# Patient Record
Sex: Male | Born: 1975 | Race: White | Hispanic: No | Marital: Single | Smoking: Current every day smoker
Health system: Southern US, Community
[De-identification: ages and names within clinical notes are randomized; demographics above are authoritative.]

## PROBLEM LIST (undated history)

## (undated) DIAGNOSIS — F319 Bipolar disorder, unspecified: Secondary | ICD-10-CM

## (undated) DIAGNOSIS — T7840XA Allergy, unspecified, initial encounter: Secondary | ICD-10-CM

## (undated) DIAGNOSIS — F1020 Alcohol dependence, uncomplicated: Secondary | ICD-10-CM

## (undated) DIAGNOSIS — J189 Pneumonia, unspecified organism: Secondary | ICD-10-CM

## (undated) DIAGNOSIS — Z720 Tobacco use: Secondary | ICD-10-CM

## (undated) DIAGNOSIS — F1010 Alcohol abuse, uncomplicated: Secondary | ICD-10-CM

## (undated) HISTORY — PX: APPENDECTOMY: SHX54

## (undated) HISTORY — DX: Allergy, unspecified, initial encounter: T78.40XA

## (undated) HISTORY — PX: HERNIA REPAIR: SHX51

## (undated) HISTORY — PX: SHOULDER ARTHROSCOPY: SHX128

---

## 2000-01-19 ENCOUNTER — Encounter: Payer: Self-pay | Admitting: Internal Medicine

## 2000-01-19 ENCOUNTER — Inpatient Hospital Stay (HOSPITAL_COMMUNITY): Admission: EM | Admit: 2000-01-19 | Discharge: 2000-01-20 | Payer: Self-pay | Admitting: Psychiatry

## 2000-01-19 ENCOUNTER — Observation Stay (HOSPITAL_COMMUNITY): Admission: EM | Admit: 2000-01-19 | Discharge: 2000-01-19 | Payer: Self-pay | Admitting: Emergency Medicine

## 2000-01-21 ENCOUNTER — Other Ambulatory Visit (HOSPITAL_COMMUNITY): Admission: RE | Admit: 2000-01-21 | Discharge: 2000-01-23 | Payer: Self-pay | Admitting: *Deleted

## 2000-06-15 ENCOUNTER — Emergency Department (HOSPITAL_COMMUNITY): Admission: EM | Admit: 2000-06-15 | Discharge: 2000-06-15 | Payer: Self-pay | Admitting: Emergency Medicine

## 2000-06-17 ENCOUNTER — Emergency Department (HOSPITAL_COMMUNITY): Admission: EM | Admit: 2000-06-17 | Discharge: 2000-06-17 | Payer: Self-pay | Admitting: Emergency Medicine

## 2001-03-16 ENCOUNTER — Encounter: Payer: Self-pay | Admitting: Emergency Medicine

## 2001-03-16 ENCOUNTER — Emergency Department (HOSPITAL_COMMUNITY): Admission: EM | Admit: 2001-03-16 | Discharge: 2001-03-16 | Payer: Self-pay | Admitting: Emergency Medicine

## 2001-04-21 ENCOUNTER — Inpatient Hospital Stay (HOSPITAL_COMMUNITY): Admission: EM | Admit: 2001-04-21 | Discharge: 2001-04-23 | Payer: Self-pay

## 2001-04-22 ENCOUNTER — Encounter: Payer: Self-pay | Admitting: General Surgery

## 2012-08-22 ENCOUNTER — Emergency Department (HOSPITAL_COMMUNITY)
Admission: EM | Admit: 2012-08-22 | Discharge: 2012-08-23 | Disposition: A | Discharge status: Home / Self Care | Payer: Self-pay | Attending: Emergency Medicine | Admitting: Emergency Medicine

## 2012-08-22 ENCOUNTER — Encounter (HOSPITAL_COMMUNITY): Payer: Self-pay | Admitting: *Deleted

## 2012-08-22 DIAGNOSIS — Z8701 Personal history of pneumonia (recurrent): Secondary | ICD-10-CM | POA: Insufficient documentation

## 2012-08-22 DIAGNOSIS — F911 Conduct disorder, childhood-onset type: Secondary | ICD-10-CM | POA: Insufficient documentation

## 2012-08-22 DIAGNOSIS — F1010 Alcohol abuse, uncomplicated: Secondary | ICD-10-CM

## 2012-08-22 DIAGNOSIS — Z0289 Encounter for other administrative examinations: Secondary | ICD-10-CM | POA: Insufficient documentation

## 2012-08-22 DIAGNOSIS — R45851 Suicidal ideations: Secondary | ICD-10-CM | POA: Insufficient documentation

## 2012-08-22 DIAGNOSIS — F17200 Nicotine dependence, unspecified, uncomplicated: Secondary | ICD-10-CM | POA: Insufficient documentation

## 2012-08-22 DIAGNOSIS — F10920 Alcohol use, unspecified with intoxication, uncomplicated: Secondary | ICD-10-CM

## 2012-08-22 DIAGNOSIS — F10929 Alcohol use, unspecified with intoxication, unspecified: Secondary | ICD-10-CM | POA: Diagnosis present

## 2012-08-22 HISTORY — DX: Pneumonia, unspecified organism: J18.9

## 2012-08-22 LAB — COMPREHENSIVE METABOLIC PANEL
ALT: 49 U/L (ref 0–53)
AST: 64 U/L — ABNORMAL HIGH (ref 0–37)
Albumin: 4.1 g/dL (ref 3.5–5.2)
Alkaline Phosphatase: 52 U/L (ref 39–117)
BUN: 9 mg/dL (ref 6–23)
CO2: 24 mEq/L (ref 19–32)
Calcium: 9.8 mg/dL (ref 8.4–10.5)
Chloride: 103 mEq/L (ref 96–112)
Creatinine, Ser: 0.72 mg/dL (ref 0.50–1.35)
GFR calc Af Amer: >90 mL/min (ref >90)
GFR calc non Af Amer: >90 mL/min (ref >90)
Glucose, Bld: 92 mg/dL (ref 70–99)
Potassium: 3.8 mEq/L (ref 3.5–5.1)
Sodium: 142 mEq/L (ref 135–145)
Total Bilirubin: 0.3 mg/dL (ref 0.3–1.2)
Total Protein: 7.8 g/dL (ref 6.0–8.3)

## 2012-08-22 LAB — CBC
HCT: 48.3 % (ref 39.0–52.0)
Hemoglobin: 16.9 g/dL (ref 13.0–17.0)
MCH: 32.9 pg (ref 26.0–34.0)
MCHC: 35 g/dL (ref 30.0–36.0)
MCV: 94 fL (ref 78.0–100.0)
Platelets: 242 10*3/uL (ref 150–400)
RBC: 5.14 MIL/uL (ref 4.22–5.81)
RDW: 13 % (ref 11.5–15.5)
WBC: 11.8 10*3/uL — ABNORMAL HIGH (ref 4.0–10.5)

## 2012-08-22 LAB — RAPID URINE DRUG SCREEN, HOSP PERFORMED
Amphetamines: NOT DETECTED
Barbiturates: NOT DETECTED
Benzodiazepines: NOT DETECTED
Cocaine: NOT DETECTED
Opiates: NOT DETECTED
Tetrahydrocannabinol: NOT DETECTED

## 2012-08-22 LAB — SALICYLATE LEVEL: Salicylate Lvl: <2 mg/dL — ABNORMAL LOW (ref 2.8–20.0)

## 2012-08-22 LAB — ACETAMINOPHEN LEVEL: Acetaminophen (Tylenol), Serum: <15 ug/mL (ref 10–30)

## 2012-08-22 MED ORDER — THIAMINE HCL 100 MG/ML IJ SOLN: 100.0000 mg | Freq: Every day | INTRAMUSCULAR | Status: DC

## 2012-08-22 MED ORDER — ADULT MULTIVITAMIN W/MINERALS CH
1.0000 | ORAL_TABLET | Freq: Every day | ORAL | Status: DC
  Administered 2012-08-23: 1 via ORAL
  Filled 2012-08-22: qty 1

## 2012-08-22 MED ORDER — FOLIC ACID 1 MG PO TABS
1.0000 mg | ORAL_TABLET | Freq: Every day | ORAL | Status: DC
  Administered 2012-08-23: 1 mg via ORAL
  Filled 2012-08-22: qty 1

## 2012-08-22 MED ORDER — VITAMIN B-1 100 MG PO TABS
100.0000 mg | ORAL_TABLET | Freq: Every day | ORAL | Status: DC
  Administered 2012-08-23: 100 mg via ORAL
  Filled 2012-08-22: qty 1

## 2012-08-22 MED ORDER — NICOTINE 21 MG/24HR TD PT24
21.0000 mg | MEDICATED_PATCH | Freq: Every day | TRANSDERMAL | Status: DC
  Administered 2012-08-22 – 2012-08-23 (×2): 21 mg via TRANSDERMAL
  Filled 2012-08-22 (×2): qty 1

## 2012-08-22 MED ORDER — LORAZEPAM 2 MG/ML IJ SOLN: 1.0000 mg | Freq: Four times a day (QID) | INTRAMUSCULAR | Status: DC | PRN

## 2012-08-22 MED ORDER — ZIPRASIDONE MESYLATE 20 MG IM SOLR
20.0000 mg | Freq: Once | INTRAMUSCULAR | Status: AC
  Administered 2012-08-22: 20 mg via INTRAMUSCULAR

## 2012-08-22 MED ORDER — ZIPRASIDONE MESYLATE 20 MG IM SOLR
10.0000 mg | Freq: Once | INTRAMUSCULAR | Status: AC
Start: 2012-08-22 — End: 2012-08-22
  Administered 2012-08-22: 10 mg via INTRAMUSCULAR
  Filled 2012-08-22: qty 20

## 2012-08-22 MED ORDER — LORAZEPAM 1 MG PO TABS
1.0000 mg | ORAL_TABLET | Freq: Four times a day (QID) | ORAL | Status: DC | PRN
  Administered 2012-08-22: 1 mg via ORAL
  Filled 2012-08-22: qty 1

## 2012-08-22 NOTE — ED Notes (Signed)
Pt refusing to change clothes, states we are violating his rights, called ? His attorney and left message for him to call. Pt continues to refuse to change, Dr Juleen China in to speak with pt, pt finally compliant to change, pt moved to room 35 in [facility] ED, Marcelino Duster updated with event.

## 2012-08-22 NOTE — ED Provider Notes (Signed)
History     CSN: [id number]  Arrival date & time 08/22/12  2234   First MD Initiated Contact with Patient 08/22/12 2248      Chief Complaint  Patient presents with  . Medical Clearance    (Consider location/radiation/quality/duration/timing/severity/associated sxs/prior treatment) HPI Comments: 37 y/o male presents to the ED via EMS after his roommate called the police because he was "acting out" after work and aggressive. Patient states he is in a fight with his girlfriend, drank 22 beers today, 1/2 adderall pill and 2 caffeine pills for no specific reason. He has been out landscaping all day. Denies SI or HI, however told nurse upon arrival "if I had a gun, I would kill me". Patient qualifies as a level 5 caveat due to intoxication.  The history is provided by the patient. The history is limited by the condition of the patient.    Past Medical History  Diagnosis Date  . Pneumonia     Past Surgical History  Procedure Laterality Date  . Appendectomy    . Hernia repair    . Shoulder arthroscopy      No family history on file.  History  Substance Use Topics  . Smoking status: Current Every Day Smoker  . Smokeless tobacco: Not on file  . Alcohol Use: Not on file      Review of Systems  Unable to perform ROS: Other    Allergies  Review of patient's allergies indicates no known allergies.  Home Medications  No current outpatient prescriptions on file.  BP 132/81  Pulse 79  Temp(Src) 98 F (36.7 C) (Oral)  Resp 18  SpO2 98%  Physical Exam  Nursing note and vitals reviewed. Constitutional: He is oriented to person, place, and time. He appears well-developed and well-nourished. He is uncooperative.  HENT:  Head: Normocephalic and atraumatic.  Mouth/Throat: Oropharynx is clear and moist.  Eyes: Conjunctivae and EOM are normal. Pupils are equal, round, and reactive to light.  Neck: Normal range of motion. Neck supple.  Cardiovascular: Normal rate, regular  rhythm and normal heart sounds.   Pulmonary/Chest: Effort normal and breath sounds normal. No respiratory distress.  Musculoskeletal: Normal range of motion. He exhibits no edema.  Neurological: He is alert and oriented to person, place, and time.  Skin: Skin is warm and dry.  Psychiatric: His affect is angry and inappropriate. His speech is slurred. He is agitated and aggressive. He expresses suicidal ideation.    ED Course  Procedures (including critical care time)  Labs Reviewed  CBC - Abnormal; Notable for the following:    WBC 11.8 (*)    All other components within normal limits  COMPREHENSIVE METABOLIC PANEL - Abnormal; Notable for the following:    AST 64 (*)    All other components within normal limits  ETHANOL - Abnormal; Notable for the following:    Alcohol, Ethyl (B) 214 (*)    All other components within normal limits  SALICYLATE LEVEL - Abnormal; Notable for the following:    Salicylate Lvl <2.0 (*)    All other components within normal limits  ACETAMINOPHEN LEVEL  URINE RAPID DRUG SCREEN (HOSP PERFORMED)   No results found.   No diagnosis found.    MDM  Patient aggressive, agitated, intoxicated. CIWA protocol. Stated to nurse "If I had a gun, I would kill me". I do not feel this patient would be safe to leave the ER, for both himself or others. Patient IVC. Geodon and restraints ordered due to  aggressive behavior. I spoke with Idalia Needle with ACT team who will assess patient. I discussed patient with Dr. Rubin Payor and Dr. Juleen China who also saw patient who agrees with plan of care. Patient moved to psych ED.         Trevor Mace, PA-C 08/23/12 [phone]

## 2012-08-22 NOTE — ED Notes (Signed)
Upon walking out of room pt states "IF I had a gun, I would kill me"

## 2012-08-22 NOTE — ED Notes (Signed)
Pt arrived via EMS, states roommate called the Police and was brought to hospital by EMS, pt states he at 4 Immodium, Drank 22 beers today, 1/4 of adderall, 1 caffiene pill, no specific reason, out landscaping all day. Pt denies SI or HI

## 2012-08-22 NOTE — ED Notes (Signed)
ZOX:[id number]> Expected date:08/22/12<BR> Expected time:10:18 PM<BR> Means of arrival:Ambulance<BR> Comments:<BR> TR Med. Clearance

## 2012-08-23 DIAGNOSIS — F10229 Alcohol dependence with intoxication, unspecified: Secondary | ICD-10-CM

## 2012-08-23 DIAGNOSIS — F10929 Alcohol use, unspecified with intoxication, unspecified: Secondary | ICD-10-CM | POA: Diagnosis present

## 2012-08-23 LAB — ETHANOL: Alcohol, Ethyl (B): 214 mg/dL — ABNORMAL HIGH (ref 0–11)

## 2012-08-23 NOTE — ED Provider Notes (Signed)
Medical screening examination/treatment/procedure(s) were conducted as a shared visit with non-physician practitioner(s) and myself.  I personally evaluated the patient during the encounter.  37 year old male with alcohol intoxication and suicidal ideation. Patient is denying any suicidal ideation to me during my evaluation. He is clinically very intoxicated and unreliable historian. I do not feel he has medical decision-making capability at this time. Patient will be involuntarily committed because of this. He will be observed until he is clinically sober and more reliable evaluation can take place.  Raeford Razor, MD 08/23/12 [phone]

## 2012-08-23 NOTE — ED Notes (Addendum)
Pt agitated and labile, pt given Ativan 1 mg po. Pt laughing inappropriately at times. Pt given meal.

## 2012-08-23 NOTE — [facility] Suicide Risk Assessment (Signed)
Suicide Risk Assessment  Discharge Assessment     Demographic Factors:  Male, Adolescent or young adult, Caucasian and Living alone  Mental Status Per Nursing Assessment::   On Admission:     Current Mental Status by Physician: Denies SI/HI AVH  Loss Factors: NA  Historical Factors: NA  Risk Reduction Factors:   Living with another person, especially a relative  Continued Clinical Symptoms:  Alcohol/Substance Abuse/Dependencies  Cognitive Features That Contribute To Risk:  NA  Suicide Risk:  DENIES  Discharge Diagnoses:   AXIS I:  Alcohol depence/intoxication AXIS II:  Deferred AXIS III:   Past Medical History  Diagnosis Date  . Pneumonia    AXIS IV:  other psychosocial or environmental problems AXIS V:  61-70 mild symptoms  Plan Of Care/Follow-up recommendations:  Information for outpatient treatment facility for alcohol given  Is patient on multiple antipsychotic therapies at discharge:  No   Has Patient had three or more failed trials of antipsychotic monotherapy by history:  No  Recommended Plan for Multiple Antipsychotic Therapies: NA  Troy Ponce, C 08/23/2012, 12:02 PM

## 2012-08-23 NOTE — BHH Counselor (Signed)
Pt still too drowsy to be assessed.  Evette Cristal, [place] Assessment Counselor

## 2012-08-23 NOTE — ED Notes (Signed)
Patient asleep and unable to do a telepsych; Dr. Juleen China aware and order d/c'd. MD to evaluate in am.

## 2012-08-23 NOTE — B[facility] Counselor (Signed)
Magistrate Maisie Fus confirmed receipt of IVC paperwork. Originals placed in IVC log in psych ed and copies placed in pt's chart.  Evette Cristal, [place] Assessment Counselor

## 2012-08-23 NOTE — Progress Notes (Signed)
[facility] has seen patient and provided him with a OC application. ° °

## 2012-08-23 NOTE — ED Provider Notes (Signed)
Resting comfortably this morning without overnight problems. Patient currently undergoing. Commitment for aggressive behavior, suicidality and alcoholism. Continue to evaluate for placement.  Gilda Crease, MD 08/23/12 [phone]

## 2012-09-15 ENCOUNTER — Emergency Department (HOSPITAL_COMMUNITY)
Admission: EM | Admit: 2012-09-15 | Discharge: 2012-09-15 | Discharge status: Against Medical Advice | Payer: Self-pay | Attending: Emergency Medicine | Admitting: Emergency Medicine

## 2012-09-15 ENCOUNTER — Encounter (HOSPITAL_COMMUNITY): Payer: Self-pay | Admitting: Emergency Medicine

## 2012-09-15 DIAGNOSIS — J029 Acute pharyngitis, unspecified: Secondary | ICD-10-CM | POA: Insufficient documentation

## 2012-09-15 DIAGNOSIS — F17200 Nicotine dependence, unspecified, uncomplicated: Secondary | ICD-10-CM | POA: Insufficient documentation

## 2012-09-15 DIAGNOSIS — F319 Bipolar disorder, unspecified: Secondary | ICD-10-CM | POA: Insufficient documentation

## 2012-09-15 DIAGNOSIS — R51 Headache: Secondary | ICD-10-CM | POA: Insufficient documentation

## 2012-09-15 HISTORY — DX: Bipolar disorder, unspecified: F31.9

## 2012-09-15 NOTE — ED Notes (Signed)
Pt c/o sore throat and frontal HA x 3 days

## 2012-09-15 NOTE — ED Notes (Signed)
Pt called x 3 and no answer.

## 2012-09-17 LAB — CULTURE, GROUP A STREP

## 2014-03-11 ENCOUNTER — Ambulatory Visit (INDEPENDENT_AMBULATORY_CARE_PROVIDER_SITE_OTHER): Payer: 59 | Admitting: Family Medicine

## 2014-03-11 VITALS — BP 121/81 | HR 106 | Temp 98.4°F | Resp 20 | Ht 69.5 in | Wt 206.4 lb

## 2014-03-11 DIAGNOSIS — F17200 Nicotine dependence, unspecified, uncomplicated: Secondary | ICD-10-CM

## 2014-03-11 DIAGNOSIS — F329 Major depressive disorder, single episode, unspecified: Secondary | ICD-10-CM

## 2014-03-11 DIAGNOSIS — G4700 Insomnia, unspecified: Secondary | ICD-10-CM

## 2014-03-11 DIAGNOSIS — F32A Depression, unspecified: Secondary | ICD-10-CM

## 2014-03-11 DIAGNOSIS — J209 Acute bronchitis, unspecified: Secondary | ICD-10-CM

## 2014-03-11 DIAGNOSIS — F909 Attention-deficit hyperactivity disorder, unspecified type: Secondary | ICD-10-CM

## 2014-03-11 DIAGNOSIS — Z720 Tobacco use: Secondary | ICD-10-CM

## 2014-03-11 MED ORDER — HYDROCOD POLST-CHLORPHEN POLST 10-8 MG/5ML PO LQCR
5.0000 mL | Freq: Two times a day (BID) | ORAL | Status: DC | PRN
Start: 2014-03-11 — End: 2015-09-30

## 2014-03-11 MED ORDER — LORAZEPAM 1 MG PO TABS: 1.0000 mg | ORAL_TABLET | Freq: Every day | ORAL | Status: DC

## 2014-03-11 MED ORDER — ALBUTEROL SULFATE HFA 108 (90 BASE) MCG/ACT IN AERS: 2.0000 | INHALATION_SPRAY | RESPIRATORY_TRACT | Status: DC | PRN

## 2014-03-11 MED ORDER — LEVOFLOXACIN 500 MG PO TABS: 500.0000 mg | ORAL_TABLET | Freq: Every day | ORAL | Status: DC

## 2014-03-11 NOTE — Progress Notes (Signed)
Subjective: 38 year old man who is here complaining of a cough for the last for 5 days. He's bringing up purulent phlegm. He has a history of getting bronchitis frequently. He smokes 4-5 packs of cigarettes a day, says that it is an obsession with him, not distended infection. He does drink. Denies use of drugs. He has a regular doctor, Selena BattenKim, at [place] practice [place] the [place] but she is not in today. He is coughing up green phlegm. He is not sure if she's had any fever. He's been very depressed since breaking up with his girlfriend 3 months ago. He denies suicidal ideation. We had a long talk about making right choices in life. We'll be sending crystals with his mother, but after being married for many years and then breaking up with his girlfriend of several years this will be his first Christmas alone. He's been having sleep disturbance and requests something for that. He wheezes sometimes apparently, asthmatic component. He has had a lot of trouble sleeping and requests something for sleep  Objective: TMs normal. Throat erythematous without exudate. Neck supple without significant nodes. Chest has minimal rhonchi. No rales or wheezes. He is very anxious.  Assessment: Bronchitis Tobacco use disorder Anxiety and depression ADHD History of bipolar  Plan:  Albuterol inhaler Tussionex Levaquin Ativan 1 mg at bedtime when necessary #30 with no refills. I do not want this refilled from this office. He needs to discuss things with his family care doctor. He may or may not be a candidate for an SSRI with his bipolar history.

## 2014-03-11 NOTE — Patient Instructions (Signed)
Drink plenty of fluids (nondairy, nonalcoholic)  Minimize her smoking. As per our discussion, at some point I would encourage you to get rid of them.  If his depression is getting worse at any time, especially if you're having suicidal thoughts, who will need to reach out for help by coming in here, going to the emergency room, going to a friend or counselor or past or family.  Take the lorazepam if needed at nighttime for sleep  Take the Levaquin 500 milligrams one daily for infection  Oriented lady use the Tussionex cough syrup 1 teaspoon twice daily only if needed for cough. We will not continue treating you long-term with this.  Use albuterol inhaler 2 inhalations every 4-6 hours as needed  Return at any time if worse or not improving

## 2015-01-18 ENCOUNTER — Other Ambulatory Visit: Payer: Self-pay | Admitting: Family Medicine

## 2015-09-02 ENCOUNTER — Emergency Department (HOSPITAL_COMMUNITY)
Admission: EM | Admit: 2015-09-02 | Discharge: 2015-09-02 | Disposition: A | Discharge status: Home / Self Care | Payer: BLUE CROSS/BLUE SHIELD | Attending: Emergency Medicine | Admitting: Emergency Medicine

## 2015-09-02 ENCOUNTER — Encounter (HOSPITAL_COMMUNITY): Payer: Self-pay | Admitting: *Deleted

## 2015-09-02 ENCOUNTER — Emergency Department (HOSPITAL_COMMUNITY): Payer: BLUE CROSS/BLUE SHIELD

## 2015-09-02 DIAGNOSIS — F319 Bipolar disorder, unspecified: Secondary | ICD-10-CM | POA: Insufficient documentation

## 2015-09-02 DIAGNOSIS — Z79899 Other long term (current) drug therapy: Secondary | ICD-10-CM | POA: Diagnosis not present

## 2015-09-02 DIAGNOSIS — M5186 Other intervertebral disc disorders, lumbar region: Secondary | ICD-10-CM | POA: Insufficient documentation

## 2015-09-02 DIAGNOSIS — M5126 Other intervertebral disc displacement, lumbar region: Secondary | ICD-10-CM

## 2015-09-02 DIAGNOSIS — F17210 Nicotine dependence, cigarettes, uncomplicated: Secondary | ICD-10-CM | POA: Insufficient documentation

## 2015-09-02 DIAGNOSIS — M545 Low back pain: Secondary | ICD-10-CM | POA: Diagnosis present

## 2015-09-02 MED ORDER — PREDNISONE 10 MG PO TABS: 20.0000 mg | ORAL_TABLET | Freq: Two times a day (BID) | ORAL | Status: DC

## 2015-09-02 MED ORDER — MORPHINE SULFATE (PF) 4 MG/ML IV SOLN
4.0000 mg | Freq: Once | INTRAVENOUS | Status: AC
  Administered 2015-09-02: 4 mg via INTRAVENOUS
  Filled 2015-09-02: qty 1

## 2015-09-02 MED ORDER — OXYCODONE-ACETAMINOPHEN 10-325 MG PO TABS: 1.0000 | ORAL_TABLET | ORAL | Status: DC | PRN

## 2015-09-02 MED ORDER — KETOROLAC TROMETHAMINE 30 MG/ML IJ SOLN
30.0000 mg | Freq: Once | INTRAMUSCULAR | Status: AC
  Administered 2015-09-02: 30 mg via INTRAVENOUS
  Filled 2015-09-02: qty 1

## 2015-09-02 MED ORDER — DIAZEPAM 5 MG/ML IJ SOLN
5.0000 mg | Freq: Once | INTRAMUSCULAR | Status: AC
  Administered 2015-09-02: 5 mg via INTRAVENOUS
  Filled 2015-09-02: qty 2

## 2015-09-02 MED ORDER — VALIUM 5 MG PO TABS: 5.0000 mg | ORAL_TABLET | Freq: Four times a day (QID) | ORAL | Status: DC | PRN

## 2015-09-02 MED ORDER — MORPHINE SULFATE (PF) 4 MG/ML IV SOLN: 4.0000 mg | Freq: Once | INTRAVENOUS | Status: DC

## 2015-09-02 NOTE — ED Notes (Signed)
[facility]

## 2015-09-02 NOTE — ED Notes (Signed)
PT reports he woke up this the back pain that is worse since he last went to his PCP.  Pt reports he finished the meds given by PCP and now pain is worse. Pt has Rt sided back pain that shoots down to RT knee.

## 2015-09-02 NOTE — Discharge Instructions (Signed)
Prednisone as prescribed.  Percocet as prescribed as needed for pain, Valium as prescribed as needed for spasm.  Follow-up with Dr. Franky Macho in the neurosurgery clinic in the next 1-2 days. The contact information for his office has been provided in this discharge summary. Please call tomorrow to make these arrangements.   Herniated Disk A herniated disk occurs when a disk in your spine bulges out too far. This condition is also called a ruptured disk or slipped disk. Your spine (backbone) is made up of bones called vertebrae. Between each pair of vertebrae is an oval disk with a soft, spongy center that acts as a shock absorber when you move. The spongy center is surrounded by a tough outer ring. When you have a herniated disk, the spongy center of the disk bulges out or ruptures through the outer ring. A herniated disk can press on a nerve between your vertebrae and cause pain. A herniated disk can occur anywhere in your back or neck area, but the lower back is the most common spot. CAUSES  In many cases, a herniated disk occurs just from getting older. As you age, the spongy insides of your disks tend to shrink and dry out. A herniated disk can result from gradual wear and tear. Injury or sudden strain can also cause a herniated disk.  RISK FACTORS Aging is the main risk factor for a herniated disk. Other risk factors include:  Being a man between the ages of 66 and 16 years.  Having a job that requires heavy lifting, bending, or twisting.  Having a job that requires long hours of driving.  Not getting enough exercise.  Being overweight.  Smoking. SIGNS AND SYMPTOMS  Signs and symptoms depend on which disk is herniated.  For a herniated disk in the lower back, you may have sharp pain in:  One part of your leg, hip, or buttocks.  The back of your calf.  The top or sole of your foot (sciatica).   For a herniated disk in the neck, you may feel pain:  When you move your  neck.  Near or over your shoulder blade.  That moves to your upper arm, forearm, or fingers.   You may also have muscle weakness. It may be hard to:  Lift your leg or arm.  Stand on your toes.  Squeeze tightly with one of your hands.  Other symptoms can include:  Numbness or tingling in the affected areas of your body.  Loss of bladder or bowel control. This is a rare but serious sign of a severe herniated disk in the lower back. DIAGNOSIS  Your health care provider will do a physical exam. During this exam, you may have to move certain body parts or assume various positions. For example, your health care provider may do the straight-leg test. This is a good way to test for a herniated disk in your lower back. In this test, the health care provider lifts your leg while you lie on your back. This is to see if you feel pain down your leg. Your health care provider will also check for numbness or loss of feeling.  Your health care provider will also check your:  Reflexes.  Muscle strength.  Posture.  Other tests may be done to help in making a diagnosis. These may include:  An X-ray of the spine to rule out other causes of back pain.   Other imaging studies, such as an MRI or CT scan. This is to check  whether the herniated disk is pressing on your spinal canal.  Electromyography (EMG). This test checks the nerves that control muscles. It is sometimes used to identify the specific area of nerve involvement.  TREATMENT  In many cases, herniated disk symptoms go away over a period of days or weeks. You will most likely be free of symptoms in 3-4 months. Treatment may include the following:  The initial treatment for a herniated disk is ashort period of rest.  Bed rest is often limited to 1 or 2 days. Resting for too long delays recovery.  If you have a herniated disk in your lower back, you should avoid sitting as much as possible because sitting increases pressure on the  disk.  Medicines. These may include:   Nonsteroidal anti-inflammatory drugs (NSAIDs).  Muscle relaxants for back spasms.  Narcotic pain medicine if your pain is very bad.   Steroid injections. You may need these along the involved nerve root to help control pain. The steroid is injected in the area of the herniated disk. It helps by reducing swelling around the disk.  Physical therapy. This may include exercises to strengthen the muscles that help support your spine.   You may need surgery if other treatments do not work.  HOME CARE INSTRUCTIONS Follow all your health care provider's instructions. These may include:  Take all medicines as directed by your health care provider.  Rest for 2 days and then start moving.  Do not sit or stand for long periods of time.  Maintain good posture when sitting and standing.  Avoid movements that cause pain, such as bending or lifting.  When you are able to start lifting things again:  [place] with your knees.  Keep your back straight.  Hold heavy objects close to your body.  If you are overweight, ask your health care provider to help you start a weight-loss program.  When you are able to start exercising, ask your health care provider how much and what type of exercise is best for you.  Work with a physical therapist on stretching and strengthening exercises for your back.  Do not wear high-heeled shoes.  Do not sleep on your belly.  Do not smoke.  Keep all follow-up visits as directed by your health care provider. SEEK MEDICAL CARE IF:  You have back or neck pain that is not getting better after 4 weeks.  You have very bad pain in your back or neck.  You develop numbness, tingling, or weakness along with pain. SEEK IMMEDIATE MEDICAL CARE IF:   You have numbness, tingling, or weakness that makes you unable to use your arms or legs.  You lose control of your bladder or bowels.  You have dizziness or fainting.  You  have shortness of breath.  MAKE SURE YOU:   Understand these instructions.  Will watch your condition.  Will get help right away if you are not doing well or get worse.   This information is not intended to replace advice given to you by your health care provider. Make sure you discuss any questions you have with your health care provider.   Document Released: 03/07/2000 Document Revised: 03/31/2014 Document Reviewed: 02/11/2013 Elsevier Interactive Patient Education [employer]2016 [employer] Inc[employer]

## 2015-09-02 NOTE — ED Provider Notes (Signed)
CSN: [id number]     Arrival date & time 09/02/15  0818 History   First MD Initiated Contact with Patient 09/02/15 [phone]     Chief Complaint  Patient presents with  . Back Pain     (Consider location/radiation/quality/duration/timing/severity/associated sxs/prior Treatment) HPI Comments: Patient is a 40 year old male with no significant past medical history. He presents for evaluation of severe low back pain that started approximately 1 week ago. This began in the absence of any injury or trauma. 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 This pain radiates into the back of his thigh and knee. It is not associated with any bowel or bladder complaints, but does feel as though his right leg is weak.  Patient is a 40 y.o. male presenting with back pain. The history is provided by the patient.  Back Pain Location:  Lumbar spine Quality:  Stabbing Radiates to:  R thigh and R knee Pain severity:  Severe Pain is:  Same all the time Onset quality:  Sudden Duration:  1 week Timing:  Constant Progression:  Worsening Chronicity:  New Relieved by:  Nothing Worsened by:  Ambulation, movement, palpation and standing Ineffective treatments: Steroids, anti-inflammatories, and Flexeril. Associated symptoms: no bladder incontinence, no bowel incontinence, no numbness and no weakness     Past Medical History  Diagnosis Date  . Pneumonia   . Bipolar disorder (HCC)   . Allergy    Past Surgical History  Procedure Laterality Date  . Appendectomy    . Hernia repair    . Shoulder arthroscopy     Family History  Problem Relation Age of Onset  . Hypertension Mother   . Hypertension Father   . Cancer Maternal Grandmother   . Heart disease Maternal Grandfather   . Cancer Paternal Grandmother   . Diabetes Paternal Grandmother   . Cancer Paternal Grandfather    Social History  Substance Use Topics  . Smoking status: Current Every Day Smoker -- 4.00 packs/day  for 30 years    Types: Cigarettes    Start date: 03/11/1984  . Smokeless tobacco: Never Used  . Alcohol Use: 3.6 oz/week    6 Cans of beer per week     Comment: former    Review of Systems  Gastrointestinal: Negative for bowel incontinence.  Genitourinary: Negative for bladder incontinence.  Musculoskeletal: Positive for back pain.  Neurological: Negative for weakness and numbness.  All other systems reviewed and are negative.     Allergies  Review of patient's allergies indicates no known allergies.  Home Medications   Prior to Admission medications   Medication Sig Start Date End Date Taking? Authorizing Provider  albuterol (PROVENTIL HFA;VENTOLIN HFA) 108 (90 BASE) MCG/ACT inhaler Inhale 2 puffs into the lungs every 4 (four) hours as needed for wheezing or shortness of breath (cough, shortness of breath or wheezing.). 03/11/14   [name], MD  amphetamine-dextroamphetamine (ADDERALL) 30 MG tablet Take 30 mg by mouth 2 (two) times daily.    Historical Provider, MD  cetirizine (ZYRTEC) 10 MG tablet Take 10 mg by mouth daily.    Historical Provider, MD  chlorpheniramine-HYDROcodone (TUSSIONEX PENNKINETIC ER) 10-8 MG/5ML LQCR Take 5 mLs by mouth every 12 (twelve) hours as needed (cough). 03/11/14   [name], MD  hydrochlorothiazide (MICROZIDE) 12.5 MG capsule Take 12.5 mg by mouth daily.    Historical Provider, MD  levofloxacin (LEVAQUIN) 500 MG tablet Take 1 tablet (500 mg total) by mouth daily.  03/11/14   Peyton Najjar, MD  LORazepam (ATIVAN) 1 MG tablet Take 1 tablet (1 mg total) by mouth at bedtime. 03/11/14   Peyton Najjar, MD  sildenafil (VIAGRA) 100 MG tablet Take 25 mg by mouth daily as needed for erectile dysfunction.    Historical Provider, MD   BP 136/85 mmHg  Pulse 113  Temp(Src) 97.7 F (36.5 C) (Oral)  Resp 16  SpO2 100% Physical Exam  Constitutional: He is oriented to person, place, and time. He appears well-developed and well-nourished.  Patient  is a 40 year old male who appears uncomfortable.  HENT:  Head: Normocephalic and atraumatic.  Neck: Normal range of motion. Neck supple.  Musculoskeletal:  There is tenderness to palpation in the right paraspinal musculature of the lumbar region. There is no bony tenderness.  Neurological: He is alert and oriented to person, place, and time.  DTRs are 2+ in the left patellar and Achilles tendons and 1+ in the right.  Strength is 5 out of 5 in both lower extremities. He is able to ambulate, however this is limited secondary to pain.  Skin: Skin is warm and dry. He is not diaphoretic.  Nursing note and vitals reviewed.   ED Course  Procedures (including critical care time) Labs Review Labs Reviewed - No data to display  Imaging Review No results found. I have personally reviewed and evaluated these images and lab results as part of my medical decision-making.    MDM   Final diagnoses:  None    MRI reveals a herniated disc at the L3-L4 level. He does have some diminished reflexes on the right, however no bowel or bladder complaints and no strength deficits. I've discussed the case with Dr. Mikal Plane who is recommending follow-up in the office in the next 1-2 days. The patient will be given steroids, pain medication, Valium, and the contact information for the neurosurgery clinic.    Geoffery Lyons, MD 09/02/15 1049

## 2015-09-17 ENCOUNTER — Other Ambulatory Visit: Payer: Self-pay | Admitting: Neurosurgery

## 2015-09-26 ENCOUNTER — Encounter (HOSPITAL_COMMUNITY): Admission: RE | Payer: Self-pay | Source: Ambulatory Visit

## 2015-09-26 ENCOUNTER — Ambulatory Visit (HOSPITAL_COMMUNITY): Admission: RE | Admit: 2015-09-26 | Payer: BLUE CROSS/BLUE SHIELD | Source: Ambulatory Visit | Admitting: Neurosurgery

## 2015-09-26 SURGERY — LUMBAR LAMINECTOMY/DECOMPRESSION MICRODISCECTOMY 1 LEVEL
Anesthesia: General | Laterality: Right

## 2015-09-27 ENCOUNTER — Emergency Department (HOSPITAL_COMMUNITY): Payer: BLUE CROSS/BLUE SHIELD

## 2015-09-27 ENCOUNTER — Observation Stay (HOSPITAL_COMMUNITY)
Admission: EM | Admit: 2015-09-27 | Discharge: 2015-09-28 | Discharge status: Against Medical Advice | Payer: BLUE CROSS/BLUE SHIELD | Attending: Internal Medicine | Admitting: Internal Medicine

## 2015-09-27 ENCOUNTER — Encounter (HOSPITAL_COMMUNITY): Payer: Self-pay

## 2015-09-27 DIAGNOSIS — Z720 Tobacco use: Secondary | ICD-10-CM

## 2015-09-27 DIAGNOSIS — M5126 Other intervertebral disc displacement, lumbar region: Secondary | ICD-10-CM | POA: Diagnosis present

## 2015-09-27 DIAGNOSIS — F319 Bipolar disorder, unspecified: Secondary | ICD-10-CM | POA: Diagnosis not present

## 2015-09-27 DIAGNOSIS — A419 Sepsis, unspecified organism: Principal | ICD-10-CM | POA: Diagnosis present

## 2015-09-27 DIAGNOSIS — R000 Tachycardia, unspecified: Secondary | ICD-10-CM | POA: Insufficient documentation

## 2015-09-27 DIAGNOSIS — F17210 Nicotine dependence, cigarettes, uncomplicated: Secondary | ICD-10-CM | POA: Insufficient documentation

## 2015-09-27 DIAGNOSIS — M25561 Pain in right knee: Secondary | ICD-10-CM | POA: Diagnosis present

## 2015-09-27 DIAGNOSIS — F1010 Alcohol abuse, uncomplicated: Secondary | ICD-10-CM | POA: Diagnosis present

## 2015-09-27 DIAGNOSIS — L03115 Cellulitis of right lower limb: Secondary | ICD-10-CM | POA: Diagnosis present

## 2015-09-27 DIAGNOSIS — S80211A Abrasion, right knee, initial encounter: Secondary | ICD-10-CM | POA: Diagnosis not present

## 2015-09-27 DIAGNOSIS — N179 Acute kidney failure, unspecified: Secondary | ICD-10-CM | POA: Diagnosis present

## 2015-09-27 DIAGNOSIS — F419 Anxiety disorder, unspecified: Secondary | ICD-10-CM | POA: Diagnosis not present

## 2015-09-27 DIAGNOSIS — G8929 Other chronic pain: Secondary | ICD-10-CM | POA: Insufficient documentation

## 2015-09-27 DIAGNOSIS — M5136 Other intervertebral disc degeneration, lumbar region: Secondary | ICD-10-CM | POA: Diagnosis not present

## 2015-09-27 HISTORY — DX: Tobacco use: Z72.0

## 2015-09-27 LAB — CBC WITH DIFFERENTIAL/PLATELET
BASOS ABS: 0 10*3/uL (ref 0.0–0.1)
Basophils Relative: 0 %
EOS ABS: 0 10*3/uL (ref 0.0–0.7)
EOS PCT: 0 %
HCT: 41 % (ref 39.0–52.0)
HEMOGLOBIN: 13.4 g/dL (ref 13.0–17.0)
LYMPHS ABS: 0.9 10*3/uL (ref 0.7–4.0)
LYMPHS PCT: 5 %
MCH: 32.3 pg (ref 26.0–34.0)
MCHC: 32.7 g/dL (ref 30.0–36.0)
MCV: 98.8 fL (ref 78.0–100.0)
Monocytes Absolute: 0.7 10*3/uL (ref 0.1–1.0)
Monocytes Relative: 4 %
NEUTROS PCT: 91 %
Neutro Abs: 15.9 10*3/uL — ABNORMAL HIGH (ref 1.7–7.7)
PLATELETS: 166 10*3/uL (ref 150–400)
RBC: 4.15 MIL/uL — AB (ref 4.22–5.81)
RDW: 13.3 % (ref 11.5–15.5)
WBC: 17.5 10*3/uL — AB (ref 4.0–10.5)

## 2015-09-27 LAB — COMPREHENSIVE METABOLIC PANEL
ALK PHOS: 79 U/L (ref 38–126)
ALT: 52 U/L (ref 17–63)
ANION GAP: 11 (ref 5–15)
AST: 53 U/L — ABNORMAL HIGH (ref 15–41)
Albumin: 3.4 g/dL — ABNORMAL LOW (ref 3.5–5.0)
BILIRUBIN TOTAL: 1.2 mg/dL (ref 0.3–1.2)
BUN: 24 mg/dL — ABNORMAL HIGH (ref 6–20)
CALCIUM: 9.5 mg/dL (ref 8.9–10.3)
CO2: 23 mmol/L (ref 22–32)
Chloride: 101 mmol/L (ref 101–111)
Creatinine, Ser: 1.41 mg/dL — ABNORMAL HIGH (ref 0.61–1.24)
GFR calc non Af Amer: >60 mL/min (ref >60)
GLUCOSE: 157 mg/dL — AB (ref 65–99)
POTASSIUM: 4.2 mmol/L (ref 3.5–5.1)
Sodium: 135 mmol/L (ref 135–145)
Total Protein: 6.9 g/dL (ref 6.5–8.1)

## 2015-09-27 LAB — LACTIC ACID, PLASMA
Lactic Acid, Venous: 1.9 mmol/L (ref 0.5–1.9)
Lactic Acid, Venous: 1.3 mmol/L (ref 0.5–1.9)

## 2015-09-27 LAB — APTT: APTT: 39 s — AB (ref 24–37)

## 2015-09-27 LAB — URINALYSIS, ROUTINE W REFLEX MICROSCOPIC
Glucose, UA: NEGATIVE mg/dL
Hgb urine dipstick: NEGATIVE
KETONES UR: 15 mg/dL — AB
NITRITE: NEGATIVE
PH: 5.5 (ref 5.0–8.0)
Protein, ur: NEGATIVE mg/dL
SPECIFIC GRAVITY, URINE: 1.032 — AB (ref 1.005–1.030)

## 2015-09-27 LAB — I-STAT CG4 LACTIC ACID, ED: Lactic Acid, Venous: 3.36 mmol/L (ref 0.5–1.9)

## 2015-09-27 LAB — URINE MICROSCOPIC-ADD ON

## 2015-09-27 LAB — SEDIMENTATION RATE: SED RATE: 45 mm/h — AB (ref 0–16)

## 2015-09-27 LAB — PROTIME-INR
INR: 1.25 (ref 0.00–1.49)
Prothrombin Time: 15.9 seconds — ABNORMAL HIGH (ref 11.6–15.2)

## 2015-09-27 LAB — C-REACTIVE PROTEIN: CRP: 21.7 mg/dL — ABNORMAL HIGH (ref <1.0)

## 2015-09-27 LAB — PROCALCITONIN: PROCALCITONIN: 3.87 ng/mL

## 2015-09-27 MED ORDER — PIPERACILLIN-TAZOBACTAM 3.375 G IVPB
3.3750 g | Freq: Three times a day (TID) | INTRAVENOUS | Status: DC
  Administered 2015-09-27 – 2015-09-28 (×2): 3.375 g via INTRAVENOUS
  Filled 2015-09-27 (×3): qty 50

## 2015-09-27 MED ORDER — OXYCODONE-ACETAMINOPHEN 5-325 MG PO TABS
1.0000 | ORAL_TABLET | ORAL | Status: AC | PRN
  Administered 2015-09-27: 1 via ORAL
  Filled 2015-09-27: qty 1

## 2015-09-27 MED ORDER — OXYCODONE-ACETAMINOPHEN 5-325 MG PO TABS
1.0000 | ORAL_TABLET | ORAL | Status: DC | PRN
  Administered 2015-09-27: 1 via ORAL

## 2015-09-27 MED ORDER — OXYCODONE-ACETAMINOPHEN 5-325 MG PO TABS
ORAL_TABLET | ORAL | Status: AC
  Filled 2015-09-27: qty 1

## 2015-09-27 MED ORDER — PIPERACILLIN-TAZOBACTAM 3.375 G IVPB 30 MIN
3.3750 g | Freq: Once | INTRAVENOUS | Status: AC
  Administered 2015-09-27: 3.375 g via INTRAVENOUS
  Filled 2015-09-27: qty 50

## 2015-09-27 MED ORDER — LORATADINE 10 MG PO TABS: 10.0000 mg | ORAL_TABLET | Freq: Every day | ORAL | Status: DC

## 2015-09-27 MED ORDER — SODIUM CHLORIDE 0.9 % IV SOLN
INTRAVENOUS | Status: DC
  Administered 2015-09-27: 23:00:00 via INTRAVENOUS

## 2015-09-27 MED ORDER — TETANUS-DIPHTH-ACELL PERTUSSIS 5-2.5-18.5 LF-MCG/0.5 IM SUSP
0.5000 mL | Freq: Once | INTRAMUSCULAR | Status: AC
  Administered 2015-09-27: 0.5 mL via INTRAMUSCULAR
  Filled 2015-09-27: qty 0.5

## 2015-09-27 MED ORDER — ACETAMINOPHEN 650 MG RE SUPP: 650.0000 mg | Freq: Four times a day (QID) | RECTAL | Status: DC | PRN

## 2015-09-27 MED ORDER — LORAZEPAM 1 MG PO TABS: 1.0000 mg | ORAL_TABLET | Freq: Four times a day (QID) | ORAL | Status: DC | PRN

## 2015-09-27 MED ORDER — MORPHINE SULFATE (PF) 4 MG/ML IV SOLN
4.0000 mg | Freq: Once | INTRAVENOUS | Status: AC
  Administered 2015-09-27: 4 mg via INTRAVENOUS
  Filled 2015-09-27: qty 1

## 2015-09-27 MED ORDER — PIPERACILLIN-TAZOBACTAM 3.375 G IVPB 30 MIN: 3.3750 g | Freq: Once | INTRAVENOUS | Status: DC

## 2015-09-27 MED ORDER — OXYCODONE HCL 5 MG PO TABS
20.0000 mg | ORAL_TABLET | ORAL | Status: DC | PRN
  Administered 2015-09-27 – 2015-09-28 (×2): 20 mg via ORAL
  Filled 2015-09-27 (×2): qty 4

## 2015-09-27 MED ORDER — VITAMIN B-1 100 MG PO TABS
100.0000 mg | ORAL_TABLET | Freq: Every day | ORAL | Status: DC
  Administered 2015-09-27: 100 mg via ORAL
  Filled 2015-09-27: qty 1

## 2015-09-27 MED ORDER — LORAZEPAM 2 MG/ML IJ SOLN: 1.0000 mg | Freq: Four times a day (QID) | INTRAMUSCULAR | Status: DC | PRN

## 2015-09-27 MED ORDER — SODIUM CHLORIDE 0.9% FLUSH
3.0000 mL | Freq: Two times a day (BID) | INTRAVENOUS | Status: DC
  Administered 2015-09-27: 3 mL via INTRAVENOUS

## 2015-09-27 MED ORDER — HEPARIN SODIUM (PORCINE) 5000 UNIT/ML IJ SOLN
5000.0000 [IU] | Freq: Three times a day (TID) | INTRAMUSCULAR | Status: DC
  Administered 2015-09-27 – 2015-09-28 (×2): 5000 [IU] via SUBCUTANEOUS
  Filled 2015-09-27 (×2): qty 1

## 2015-09-27 MED ORDER — ALBUTEROL SULFATE (2.5 MG/3ML) 0.083% IN NEBU
3.0000 mL | INHALATION_SOLUTION | RESPIRATORY_TRACT | Status: DC | PRN
  Administered 2015-09-27: 3 mL via RESPIRATORY_TRACT
  Filled 2015-09-27: qty 3

## 2015-09-27 MED ORDER — VANCOMYCIN HCL IN DEXTROSE 1-5 GM/200ML-% IV SOLN
1000.0000 mg | Freq: Once | INTRAVENOUS | Status: AC
  Administered 2015-09-27: 1000 mg via INTRAVENOUS
  Filled 2015-09-27: qty 200

## 2015-09-27 MED ORDER — ALUM & MAG HYDROXIDE-SIMETH 200-200-20 MG/5ML PO SUSP
30.0000 mL | ORAL | Status: DC | PRN
  Administered 2015-09-27: 30 mL via ORAL
  Filled 2015-09-27: qty 30

## 2015-09-27 MED ORDER — AMPHETAMINE-DEXTROAMPHETAMINE 10 MG PO TABS: 30.0000 mg | ORAL_TABLET | Freq: Two times a day (BID) | ORAL | Status: DC

## 2015-09-27 MED ORDER — LORAZEPAM 2 MG/ML IJ SOLN: 0.0000 mg | Freq: Four times a day (QID) | INTRAMUSCULAR | Status: DC

## 2015-09-27 MED ORDER — ONDANSETRON HCL 4 MG/2ML IJ SOLN: 4.0000 mg | Freq: Three times a day (TID) | INTRAMUSCULAR | Status: DC | PRN

## 2015-09-27 MED ORDER — SODIUM CHLORIDE 0.9 % IV BOLUS (SEPSIS)
1000.0000 mL | Freq: Once | INTRAVENOUS | Status: AC
  Administered 2015-09-27: 1000 mL via INTRAVENOUS

## 2015-09-27 MED ORDER — OXYCODONE-ACETAMINOPHEN 5-325 MG PO TABS: 2.0000 | ORAL_TABLET | ORAL | Status: DC | PRN

## 2015-09-27 MED ORDER — ACETAMINOPHEN 325 MG PO TABS
650.0000 mg | ORAL_TABLET | Freq: Four times a day (QID) | ORAL | Status: DC | PRN
  Administered 2015-09-28: 650 mg via ORAL
  Filled 2015-09-27: qty 2

## 2015-09-27 MED ORDER — VANCOMYCIN HCL IN DEXTROSE 1-5 GM/200ML-% IV SOLN: 1000.0000 mg | Freq: Once | INTRAVENOUS | Status: DC

## 2015-09-27 MED ORDER — ADULT MULTIVITAMIN W/MINERALS CH
1.0000 | ORAL_TABLET | Freq: Every day | ORAL | Status: DC
  Administered 2015-09-27: 1 via ORAL
  Filled 2015-09-27: qty 1

## 2015-09-27 MED ORDER — THIAMINE HCL 100 MG/ML IJ SOLN: 100.0000 mg | Freq: Every day | INTRAMUSCULAR | Status: DC

## 2015-09-27 MED ORDER — PIPERACILLIN-TAZOBACTAM 3.375 G IVPB 30 MIN: 3.3750 g | Freq: Three times a day (TID) | INTRAVENOUS | Status: DC

## 2015-09-27 MED ORDER — LORAZEPAM 2 MG/ML IJ SOLN
0.0000 mg | Freq: Two times a day (BID) | INTRAMUSCULAR | Status: DC
Start: 2015-09-29 — End: 2015-09-28

## 2015-09-27 MED ORDER — FOLIC ACID 1 MG PO TABS
1.0000 mg | ORAL_TABLET | Freq: Every day | ORAL | Status: DC
  Administered 2015-09-27: 1 mg via ORAL
  Filled 2015-09-27: qty 1

## 2015-09-27 MED ORDER — VANCOMYCIN HCL IN DEXTROSE 1-5 GM/200ML-% IV SOLN
1000.0000 mg | Freq: Two times a day (BID) | INTRAVENOUS | Status: DC
  Administered 2015-09-28: 1000 mg via INTRAVENOUS
  Filled 2015-09-27 (×2): qty 200

## 2015-09-27 MED ORDER — NICOTINE 21 MG/24HR TD PT24
21.0000 mg | MEDICATED_PATCH | Freq: Once | TRANSDERMAL | Status: DC
  Administered 2015-09-27: 21 mg via TRANSDERMAL
  Filled 2015-09-27: qty 1

## 2015-09-27 NOTE — ED Provider Notes (Signed)
CSN: [id number]     Arrival date & time 09/27/15  1550 History   First MD Initiated Contact with Patient 09/27/15 1650     Chief Complaint  Patient presents with  . Wound Infection     (Consider location/radiation/quality/duration/timing/severity/associated sxs/prior Treatment) The history is provided by the patient.  Janan RidgeJustin A Lasorsa is a 40 y.o. male hx of bipolar, pneumonia, here with right knee swelling and redness. 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 Has progressively worsening swelling and redness of the right knee. Patient also has some subjective fevers and chills. Sometimes her doctor 2 days ago started on Keflex were given a dose of Rocephin but did not help with his symptoms. Denies any IV drug use or history of MRSA. Denies any history of joint infections. Tdap not up to date.   Past Medical History  Diagnosis Date  . Pneumonia   . Bipolar disorder (HCC)   . Allergy    Past Surgical History  Procedure Laterality Date  . Appendectomy    . Hernia repair    . Shoulder arthroscopy     Family History  Problem Relation Age of Onset  . Hypertension Mother   . Hypertension Father   . Cancer Maternal Grandmother   . Heart disease Maternal Grandfather   . Cancer Paternal Grandmother   . Diabetes Paternal Grandmother   . Cancer Paternal Grandfather    Social History  Substance Use Topics  . Smoking status: Current Every Day Smoker -- 2.00 packs/day for 30 years    Types: Cigarettes    Start date: 03/11/1984  . Smokeless tobacco: Never Used  . Alcohol Use: 3.6 oz/week    6 Cans of beer per week     Comment: former    Review of Systems  Skin: Positive for rash and wound.  All other systems reviewed and are negative.     Allergies  Other and Broccoli  Home Medications   Prior to Admission medications   Medication Sig Start Date End Date Taking? Authorizing Provider   albuterol (PROVENTIL HFA;VENTOLIN HFA) 108 (90 BASE) MCG/ACT inhaler Inhale 2 puffs into the lungs every 4 (four) hours as needed for wheezing or shortness of breath (cough, shortness of breath or wheezing.). Patient taking differently: Inhale 2 puffs into the lungs every 4 (four) hours as needed for wheezing or shortness of breath.  03/11/14  Yes [name], MD  amphetamine-dextroamphetamine (ADDERALL) 30 MG tablet Take 30 mg by mouth 2 (two) times daily.   Yes Historical Provider, MD  cephALEXin (KEFLEX) 500 MG capsule Take 500 mg by mouth 4 (four) times daily. For 10 days (Start date 09/26/15) 09/26/15  Yes Historical Provider, MD  cetirizine (ZYRTEC) 10 MG tablet Take 10 mg by mouth daily.   Yes Historical Provider, MD  sildenafil (VIAGRA) 100 MG tablet Take 25-100 mg by mouth daily as needed for erectile dysfunction.    Yes Historical Provider, MD  chlorpheniramine-HYDROcodone (TUSSIONEX PENNKINETIC ER) 10-8 MG/5ML LQCR Take 5 mLs by mouth every 12 (twelve) hours as needed (cough). Patient not taking: Reported on 09/27/2015 03/11/14   [name], MD  levofloxacin (LEVAQUIN) 500 MG tablet Take 1 tablet (500 mg total) by mouth daily. Patient not taking: Reported on 09/27/2015 03/11/14   [name], MD  LORazepam (ATIVAN) 1 MG tablet Take 1 tablet (1 mg total) by mouth at bedtime.  Patient not taking: Reported on 09/27/2015 03/11/14   [name], MD  oxyCODONE-acetaminophen (PERCOCET) 10-325 MG tablet Take 1 tablet by mouth every 4 (four) hours as needed for pain. Patient not taking: Reported on 09/27/2015 09/02/15   Geoffery Lyonsouglas Delo, MD  predniSONE (DELTASONE) 10 MG tablet Take 2 tablets (20 mg total) by mouth 2 (two) times daily. Patient not taking: Reported on 09/27/2015 09/02/15   Geoffery Lyonsouglas Delo, MD  VALIUM 5 MG tablet Take 1 tablet (5 mg total) by mouth every 6 (six) hours as needed for anxiety (spasms). Patient not taking: Reported on 09/27/2015 09/02/15   Geoffery Lyonsouglas Delo, MD   BP 121/64 mmHg   Pulse 116  Temp(Src) 98.8 F (37.1 C) (Oral)  Resp 18  Wt 200 lb (90.719 kg)  SpO2 97% Physical Exam  Constitutional: He is oriented to person, place, and time.  Uncomfortable   HENT:  Head: Normocephalic.  Mouth/Throat: Oropharynx is clear and moist.  Eyes: Conjunctivae are normal. Pupils are equal, round, and reactive to light.  Neck: Normal range of motion. Neck supple.  Cardiovascular: Normal rate, regular rhythm and normal heart sounds.   Pulmonary/Chest: Effort normal and breath sounds normal. No respiratory distress. He has no wheezes. He has no rales.  Abdominal: Soft. Bowel sounds are normal. He exhibits no distension. There is no tenderness. There is no rebound.  Musculoskeletal:  R knee cellulitis, small effusion, nl ROM. Surrounding cellulitis extend to mid calf and mid thigh. 2+ pulses. No fluctuance   Neurological: He is alert and oriented to person, place, and time.  Skin: Skin is warm. There is erythema.  Psychiatric: He has a normal mood and affect. His behavior is normal. Judgment and thought content normal.  Nursing note and vitals reviewed.   ED Course  Procedures (including critical care time) Labs Review Labs Reviewed  COMPREHENSIVE METABOLIC PANEL - Abnormal; Notable for the following:    Glucose, Bld 157 (*)    BUN 24 (*)    Creatinine, Ser 1.41 (*)    Albumin 3.4 (*)    AST 53 (*)    All other components within normal limits  CBC WITH DIFFERENTIAL/PLATELET - Abnormal; Notable for the following:    WBC 17.5 (*)    RBC 4.15 (*)    Neutro Abs 15.9 (*)    All other components within normal limits  I-STAT CG4 LACTIC ACID, ED - Abnormal; Notable for the following:    Lactic Acid, Venous 3.36 (*)    All other components within normal limits  CULTURE, BLOOD (ROUTINE X 2)  CULTURE, BLOOD (ROUTINE X 2)  URINE CULTURE  URINALYSIS, ROUTINE W REFLEX MICROSCOPIC (NOT AT [facility])    Imaging Review Dg Chest 2 View  09/27/2015  CLINICAL DATA:  Cough and chest  congestion over the last few days. EXAM: CHEST  2 VIEW COMPARISON:  None. FINDINGS: Heart size is normal. Mediastinal shadows are normal. The lungs are clear. No bronchial thickening. No infiltrate, mass, effusion or collapse. Pulmonary vascularity is normal. No bony abnormality. IMPRESSION: Normal chest Electronically Signed   By: Paulina FusiMark  Shogry M.D.   On: 09/27/2015 16:44   Dg Knee Complete 4 Views Right  09/27/2015  CLINICAL DATA:  Right knee pain and swelling for 2 weeks. No known injury. EXAM: RIGHT KNEE - COMPLETE 4+ VIEW COMPARISON:  None. FINDINGS: No evidence of fracture or dislocation. No evidence of arthropathy or other focal bone abnormality. Possible small suprapatellar joint effusion. Diffuse soft tissue edema appears most prominent at.  No soft tissue air. No radiopaque foreign body. IMPRESSION: Soft tissue edema most prominent anteriorly. Small joint effusion. No bony abnormality. Electronically Signed   By: Rubye Oaks M.D.   On: 09/27/2015 18:05   I have personally reviewed and evaluated these images and lab results as part of my medical decision-making.   EKG Interpretation None      MDM   Final diagnoses:  None    SHERMAINE BRIGHAM is a 40 y.o. male here with right knee redness and cellulitis, failed outpatient keflex. Tachycardic and slightly hypotensive in triage, sepsis workup initiated in triage. Code sepsis initiated and I ordered 30 cc/kg bolus.  6:28 PM BP improved after bolus. Tachycardia improved. WBC 17. Lactate 3.4. Knee xray showed small effusion. There is overlying cellulitis so I am not comfortable with knee aspiration and I have low suspicion for septic knee. Will admit for IV abx. Hospitalist request ortho consult, discussed with Dr. Lajoyce Corners, who recommend IV abx and he will see him tomorrow    Richardean Canal, MD 09/27/15 1840

## 2015-09-27 NOTE — ED Notes (Signed)
Attempted report, RN request repeat lactic results prior to transport, charge RN made aware of the same

## 2015-09-27 NOTE — ED Notes (Signed)
Admitting MD at bedside.

## 2015-09-27 NOTE — ED Notes (Signed)
Attempted report 

## 2015-09-27 NOTE — ED Notes (Signed)
Pt Yao notified of critical lactic acid 3.36

## 2015-09-27 NOTE — ED Notes (Addendum)
Per PT, Pt is coming from home with complaints of chronic lower back pain and a worsening of pain to the right leg. Pt reports noting a infection to his right knee that significantly worsened overnight. Erythema and edema noted to right leg from mid thigh to mid calf. Pt reports surgery for broken disc to be tomorrow, but patient is unable to get surger due to his infection in his right leg. Occupation of a Administratorlandscaper. Unknown of source of infection or bone break.

## 2015-09-27 NOTE — Progress Notes (Signed)
Pt arrived from ED for cellulitis on R knee, pt a/o, c/o chronic back pain, PRN meds as ordered, pt on NS @ 125 ml/hr, abx as given, pt stable

## 2015-09-27 NOTE — ED Notes (Signed)
Given food with EDP approval.  

## 2015-09-27 NOTE — ED Notes (Signed)
Pt ambulatory back from being outside.  Will continue to monitor, no acute distress at this time

## 2015-09-27 NOTE — H&P (Signed)
History and Physical    Troy Ponce OAC:[id number] DOB: [birth year] DOA: 09/27/2015  Referring MD/NP/PA:   PCP: Troy Pillow, NP   Patient coming from:  The patient is coming from home.  At baseline, pt is independent for most of ADL.      Chief Complaint: pain and swelling in right knee and leg  HPI: Troy Ponce is a 40 y.o. male with medical history significant of allergy, bipolar, tobacco abuse, anxiety, alcohol use, who presents with pain and swelling in the right knee and leg.  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 It has been progressively getting worse. His right knee becomes red. The pain is constantly, severe, nonradiating. His symptoms have worsened since last night. He also had subjective fever and chills. His right leg is erythematous, mildly swelling and painful. He states that was seen by his PCP, who started him with oral Keflex after given one dose of rocephin 2 days ago, without significant help. He states that he tried to use a needle to relief pressures from his right knee without success at home. Patient has nausea, but no vomiting, diarrhea, abdominal pain. No chest pain, shortness breath, cough, symptoms of UTI or unilateral weakness. Of note, pt states that he has chronic back pain due to lumbar disc problem and was scheduled for surgery tomorrow, but patient is unable to get surger due to his infection in his right leg and knee. Patient states that his lower back pains constant, moderate, radiating to right leg posteriorly sometime. No alarm symptoms, such as leg weakness, urinary incontinence or lost control of BM.  ED Course: pt was found to have WBC 17.5, lactate 3.36, temperature 99.8, tachycardia, no tachypnea, blood pressure 99/82, AKI with cre 1.41. CXR is negative. X-ray of right knee showed soft tissue edema most prominent anteriorely, small  joint effusion and no bony abnormality. Pt is placed on tele bed for obs. Ortho, Dr. Sharol Given was consulted by EDP.  Review of Systems:   General: has subjective fevers, chills, no changes in body weight, has poor appetite, has fatigue HEENT: no blurry vision, hearing changes or sore throat Pulm: no dyspnea, coughing, wheezing CV: no chest pain, no palpitations Abd: has nausea, no vomiting, abdominal pain, diarrhea, constipation GU: no dysuria, burning on urination, increased urinary frequency, hematuria  Ext: no leg edema Neuro: no unilateral weakness, numbness, or tingling, no vision change or hearing loss Skin: no rash MSK: has pain and swelling in right knee and leg. Heme: No easy bruising.  Travel history: No recent long distant travel.  Allergy:  Allergies  Allergen Reactions  . Other Anaphylaxis    Unknown B/P med (refer to: Everardo Beals at [facility] [place] [phone])   . Broccoli [Brassica Oleracea Italica] Other (See Comments)    "stomach feels like razor blades" if eaten     Past Medical History  Diagnosis Date  . Pneumonia   . Bipolar disorder ([place])   . Allergy   . Tobacco abuse     Past Surgical History  Procedure Laterality Date  . Appendectomy    . Hernia repair    . Shoulder arthroscopy      Social History:  reports that he has been smoking Cigarettes.  He started smoking about 31 years ago. He has a 60 pack-year smoking history. He has never  used smokeless tobacco. He reports that he drinks about 3.6 oz of alcohol per week. He reports that he does not use illicit drugs.  Family History:  Family History  Problem Relation Age of Onset  . Hypertension Mother   . Hypertension Father   . Cancer Maternal Grandmother   . Heart disease Maternal Grandfather   . Cancer Paternal Grandmother   . Diabetes Paternal Grandmother   . Cancer Paternal Grandfather      Prior to Admission medications   Medication Sig Start Date End  Date Taking? Authorizing Provider  albuterol (PROVENTIL HFA;VENTOLIN HFA) 108 (90 BASE) MCG/ACT inhaler Inhale 2 puffs into the lungs every 4 (four) hours as needed for wheezing or shortness of breath (cough, shortness of breath or wheezing.). 03/11/14  Yes Posey Boyer, MD  amphetamine-dextroamphetamine (ADDERALL) 30 MG tablet Take 30 mg by mouth 2 (two) times daily.   Yes Historical Provider, MD  sildenafil (VIAGRA) 100 MG tablet Take 25-100 mg by mouth daily as needed for erectile dysfunction.    Yes Historical Provider, MD  cephALEXin (KEFLEX) 500 MG capsule Take 500 mg by mouth 4 (four) times daily. For 10 days (Start date 09/26/15) 09/26/15   Historical Provider, MD  cetirizine (ZYRTEC) 10 MG tablet Take 10 mg by mouth daily.    Historical Provider, MD  cetirizine (ZYRTEC) 10 MG tablet Take 10 mg by mouth daily.    Historical Provider, MD  chlorpheniramine-HYDROcodone (TUSSIONEX PENNKINETIC ER) 10-8 MG/5ML LQCR Take 5 mLs by mouth every 12 (twelve) hours as needed (cough). Patient not taking: Reported on 09/27/2015 03/11/14   Posey Boyer, MD  levofloxacin (LEVAQUIN) 500 MG tablet Take 1 tablet (500 mg total) by mouth daily. Patient not taking: Reported on 09/27/2015 03/11/14   Posey Boyer, MD  LORazepam (ATIVAN) 1 MG tablet Take 1 tablet (1 mg total) by mouth at bedtime. Patient not taking: Reported on 09/27/2015 03/11/14   Posey Boyer, MD  oxyCODONE-acetaminophen (PERCOCET) 10-325 MG tablet Take 1 tablet by mouth every 4 (four) hours as needed for pain. 09/02/15   Veryl Speak, MD  predniSONE (DELTASONE) 10 MG tablet Take 2 tablets (20 mg total) by mouth 2 (two) times daily. 09/02/15   Veryl Speak, MD  VALIUM 5 MG tablet Take 1 tablet (5 mg total) by mouth every 6 (six) hours as needed for anxiety (spasms). 09/02/15   Veryl Speak, MD    Physical Exam: Filed Vitals:   09/27/15 1930 09/27/15 2000 09/27/15 2015 09/27/15 2052  BP: 115/56 129/74 120/68 118/65  Pulse: 108 111 109 111  Temp:   99.6 F (37.6 C)  100.8 F (38.2 C)  TempSrc:  Oral  Oral  Resp:  20 25 20   Height:    5' 10.5" (1.791 m)  Weight:    95.8 kg (211 lb 3.2 oz)  SpO2: 93% 98% 97% 100%   General: Not in acute distress HEENT:       Eyes: PERRL, EOMI, no scleral icterus.       ENT: No discharge from the ears and nose, no pharynx injection, no tonsillar enlargement.        Neck: No JVD, no bruit, no mass felt. Heme: No neck lymph node enlargement. Cardiac: S1/S2, RRR, No murmurs, No gallops or rubs. Pulm: No rales, wheezing, rhonchi or rubs. Abd: Soft, nondistended, nontender, no rebound pain, no organomegaly, BS present. GU: No hematuria Ext: 2+DP/PT pulse bilaterally. There is erythema, warmth, swelling, tenderness in right knee joint, extending down  to mid calf and up to mid thigh. Normal range of motion in right knee joint. Musculoskeletal: No joint deformities, No joint redness or warmth, no limitation of ROM in spin. Skin: No rashes.  Neuro: Alert, oriented X3, cranial nerves II-XII grossly intact, moves all extremities normally.  Psych: Patient is not psychotic, no suicidal or hemocidal ideation.  Labs on Admission: I have personally reviewed following labs and imaging studies  CBC:  Recent Labs Lab 09/27/15 1624  WBC 17.5*  NEUTROABS 15.9*  HGB 13.4  HCT 41.0  MCV 98.8  PLT 235   Basic Metabolic Panel:  Recent Labs Lab 09/27/15 1624  NA 135  K 4.2  CL 101  CO2 23  GLUCOSE 157*  BUN 24*  CREATININE 1.41*  CALCIUM 9.5   GFR: Estimated Creatinine Clearance: 79.9 mL/min (by C-G formula based on Cr of 1.41). Liver Function Tests:  Recent Labs Lab 09/27/15 1624  AST 53*  ALT 52  ALKPHOS 79  BILITOT 1.2  PROT 6.9  ALBUMIN 3.4*   No results for input(s): LIPASE, AMYLASE in the last 168 hours. No results for input(s): AMMONIA in the last 168 hours. Coagulation Profile:  Recent Labs Lab 09/27/15 2000  INR 1.25   Cardiac Enzymes: No results for input(s): CKTOTAL,  CKMB, CKMBINDEX, TROPONINI in the last 168 hours. BNP (last 3 results) No results for input(s): PROBNP in the last 8760 hours. HbA1C: No results for input(s): HGBA1C in the last 72 hours. CBG: No results for input(s): GLUCAP in the last 168 hours. Lipid Profile: No results for input(s): CHOL, HDL, LDLCALC, TRIG, CHOLHDL, LDLDIRECT in the last 72 hours. Thyroid Function Tests: No results for input(s): TSH, T4TOTAL, FREET4, T3FREE, THYROIDAB in the last 72 hours. Anemia Panel: No results for input(s): VITAMINB12, FOLATE, FERRITIN, TIBC, IRON, RETICCTPCT in the last 72 hours. Urine analysis:    Component Value Date/Time   COLORURINE ORANGE* 09/27/2015 1829   APPEARANCEUR CLEAR 09/27/2015 1829   LABSPEC 1.032* 09/27/2015 1829   PHURINE 5.5 09/27/2015 1829   GLUCOSEU NEGATIVE 09/27/2015 1829   HGBUR NEGATIVE 09/27/2015 1829   BILIRUBINUR SMALL* 09/27/2015 1829   KETONESUR 15* 09/27/2015 1829   PROTEINUR NEGATIVE 09/27/2015 1829   NITRITE NEGATIVE 09/27/2015 1829   LEUKOCYTESUR TRACE* 09/27/2015 1829   Sepsis Labs: @LABRCNTIP (procalcitonin:4,lacticidven:4) )No results found for this or any previous visit (from the past 240 hour(s)).   Radiological Exams on Admission: Dg Chest 2 View  09/27/2015  CLINICAL DATA:  Cough and chest congestion over the last few days. EXAM: CHEST  2 VIEW COMPARISON:  None. FINDINGS: Heart size is normal. Mediastinal shadows are normal. The lungs are clear. No bronchial thickening. No infiltrate, mass, effusion or collapse. Pulmonary vascularity is normal. No bony abnormality. IMPRESSION: Normal chest Electronically Signed   By: Nelson Chimes M.D.   On: 09/27/2015 16:44   Dg Knee Complete 4 Views Right  09/27/2015  CLINICAL DATA:  Right knee pain and swelling for 2 weeks. No known injury. EXAM: RIGHT KNEE - COMPLETE 4+ VIEW COMPARISON:  None. FINDINGS: No evidence of fracture or dislocation. No evidence of arthropathy or other focal bone abnormality. Possible  small suprapatellar joint effusion. Diffuse soft tissue edema appears most prominent at. No soft tissue air. No radiopaque foreign body. IMPRESSION: Soft tissue edema most prominent anteriorly. Small joint effusion. No bony abnormality. Electronically Signed   By: Jeb Levering M.D.   On: 09/27/2015 18:05     EKG: Not done in ED, will get one.  Assessment/Plan Principal Problem:   Cellulitis of knee, right Active Problems:   Bipolar disorder (HCC)   Lumbar disc herniation   Tobacco abuse   Sepsis ([place])   Right knee pain   Alcohol use   Cellulitis of right lower leg   AKI (acute kidney injury) ([place])   Cellulitis of right knee and right leg and sepsis: pt is septic with elevated lactate 3.36, tachycardia, leukocytosis. Blood pressure is soft on admission. Currently hemodynamically stable. Orthopedic surgeon, Dr. Sharol Given was consulted.  - will place on tele bed for obs - Empiric Abx vancomycin and Zosyn were started in ED, will continue  - PRN Zofran for nausea, Percocet for pain - Blood cultures x 2  - ESR and CRP - will get Procalcitonin and trend lactic acid levels per sepsis protocol. - IVF: 3.0L of NS bolus in ED, followed by 15 cc/h - f/u rtho recommendations in AM  AKI: Cre 1.41 BUN 24, Likely due to prerenal secondary to dehydration. UA has trace amount of leukocytes, patient is asymptomatic. - IVF as above - Check FeNa  - Follow up renal function by BMP  Bipolar disorder ([place]): Stable, no suicidal or homicidal ideations. -Continue home medications: When necessary Ativan and Adderall  Tobacco abuse and Alcohol use: -Did counseling about importance of quitting smoking -Nicotine patch -Did counseling about the importance of quitting drinking -CIWA protocol  Lumbar disc herniation: MRI-lumbar spin on 09/02/15 showed right foraminal to extra foraminal disc herniation likely to compress the right L3 nerve root. Chronic disc degeneration at L4-5 but without definite neural  compression. L5-S1: Chronic left-sided disc degeneration at L5-S1 with some potential to affect the left S1 nerve root. His surgery is postponed due to current infection -Follow-up with orthopedic surgeon -When necessary Percocet for pain   DVT ppx: SQ Heparin  Code Status: Full code Family Communication: None at bed side.  Disposition Plan:  Anticipate discharge back to previous home environment Consults called:  Ortho, Dr. Sharol Given Admission status: Obs / tele  Date of Service 09/27/2015    Ivor Costa [facility] Pager [phone]  If 7PM-7AM, please contact night-coverage [email or web address] Password [facility] 09/27/2015, 9:42 PM

## 2015-09-27 NOTE — Progress Notes (Signed)
Pharmacy Antibiotic Note  Troy Ponce is a 40 y.o. male admitted on 09/27/2015 with cellulitis.  Pharmacy has been consulted for vancomycin and zosyn dosing. Pt is afebrile but WBC is elevated at 17.5. Lactic acid is elevated at 3.36. SCr is above baseline at 1.41. First doses already give per ED provider.   Plan: - Vancomycin 1gm IV Q12H - Zosyn 3.375gm IV Q8H (4 hr inf) - F/u renal fxn, C&S, clinical status and trough at SS  Height: 5' 9.69" (177 cm) Weight: 200 lb (90.719 kg) IBW/kg (Calculated) : 72.28  Temp (24hrs), Avg:98.8 F (37.1 C), Min:98.8 F (37.1 C), Max:98.8 F (37.1 C)   Recent Labs Lab 09/27/15 1624 09/27/15 1644  WBC 17.5*  --   CREATININE 1.41*  --   LATICACIDVEN  --  3.36*    Estimated Creatinine Clearance: 76.9 mL/min (by C-G formula based on Cr of 1.41).    Allergies  Allergen Reactions  . Other Anaphylaxis    Unknown B/P med (refer to: Troy PandaKimberly Ponce at [facility]- [address] [phone])   . Broccoli [Brassica Oleracea Italica] Other (See Comments)    "stomach feels like razor blades" if eaten     Antimicrobials this admission: Vanc 7/6>> Zosyn 7/6>>  Dose adjustments this admission: N/A  Microbiology results: Pending  Thank you for allowing pharmacy to be a part of this patient's care.  Troy Ponce, Troy LeachRachel Ponce 09/27/2015 6:53 PM

## 2015-09-27 NOTE — ED Notes (Signed)
[facility] contacted to call Code Sepsis 

## 2015-09-28 LAB — BASIC METABOLIC PANEL
Anion gap: 4 — ABNORMAL LOW (ref 5–15)
BUN: 17 mg/dL (ref 6–20)
CALCIUM: 8.2 mg/dL — AB (ref 8.9–10.3)
CO2: 25 mmol/L (ref 22–32)
CREATININE: 0.94 mg/dL (ref 0.61–1.24)
Chloride: 105 mmol/L (ref 101–111)
GLUCOSE: 115 mg/dL — AB (ref 65–99)
Potassium: 3.9 mmol/L (ref 3.5–5.1)
Sodium: 134 mmol/L — ABNORMAL LOW (ref 135–145)

## 2015-09-28 LAB — URINE CULTURE: CULTURE: NO GROWTH

## 2015-09-28 LAB — CBC
HCT: 33.8 % — ABNORMAL LOW (ref 39.0–52.0)
Hemoglobin: 11.3 g/dL — ABNORMAL LOW (ref 13.0–17.0)
MCH: 33.1 pg (ref 26.0–34.0)
MCHC: 33.4 g/dL (ref 30.0–36.0)
MCV: 99.1 fL (ref 78.0–100.0)
PLATELETS: 143 10*3/uL — AB (ref 150–400)
RBC: 3.41 MIL/uL — AB (ref 4.22–5.81)
RDW: 13.6 % (ref 11.5–15.5)
WBC: 13.2 10*3/uL — ABNORMAL HIGH (ref 4.0–10.5)

## 2015-09-28 LAB — HIV ANTIBODY (ROUTINE TESTING W REFLEX): HIV SCREEN 4TH GENERATION: NONREACTIVE

## 2015-09-28 LAB — SODIUM, URINE, RANDOM: SODIUM UR: 55 mmol/L

## 2015-09-28 LAB — CREATININE, URINE, RANDOM: CREATININE, URINE: 172.87 mg/dL

## 2015-09-28 NOTE — Consult Note (Signed)
ORTHOPAEDIC CONSULTATION  REQUESTING PHYSICIAN: Clydia LlanoMutaz Elmahi, MD  Chief Complaint: Cellulitis left knee  HPI: Troy Ponce is a 40 y.o. male who presents with cellulitis left knee patient states he has had episodes of fevers. 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  Past Medical History  Diagnosis Date  . Pneumonia   . Bipolar disorder (HCC)   . Allergy   . Tobacco abuse    Past Surgical History  Procedure Laterality Date  . Appendectomy    . Hernia repair    . Shoulder arthroscopy     Social History   Social History  . Marital Status: Single    Spouse Name: N/A  . Number of Children: N/A  . Years of Education: N/A   Social History Main Topics  . Smoking status: Current Every Day Smoker -- 2.00 packs/day for 30 years    Types: Cigarettes    Start date: 03/11/1984  . Smokeless tobacco: Never Used  . Alcohol Use: 3.6 oz/week    6 Cans of beer per week     Comment: former  . Drug Use: No     Comment: adderall  . Sexual Activity: Not Asked   Other Topics Concern  . None   Social History Narrative   Family History  Problem Relation Age of Onset  . Hypertension Mother   . Hypertension Father   . Cancer Maternal Grandmother   . Heart disease Maternal Grandfather   . Cancer Paternal Grandmother   . Diabetes Paternal Grandmother   . Cancer Paternal Grandfather    - negative except otherwise stated in the family history section Allergies  Allergen Reactions  . Other Anaphylaxis    Unknown B/P med (refer to: Marva PandaKimberly Millsaps at [facility]- [address] [phone])   . Broccoli [Brassica Oleracea Italica] Other (See Comments)    "stomach feels like razor blades" if eaten    Prior to Admission medications   Medication Sig Start Date End Date Taking? Authorizing Provider  albuterol (PROVENTIL HFA;VENTOLIN HFA) 108  (90 BASE) MCG/ACT inhaler Inhale 2 puffs into the lungs every 4 (four) hours as needed for wheezing or shortness of breath (cough, shortness of breath or wheezing.). Patient taking differently: Inhale 2 puffs into the lungs every 4 (four) hours as needed for wheezing or shortness of breath.  03/11/14  Yes [name], MD  amphetamine-dextroamphetamine (ADDERALL) 30 MG tablet Take 30 mg by mouth 2 (two) times daily.   Yes Historical Provider, MD  cephALEXin (KEFLEX) 500 MG capsule Take 500 mg by mouth 4 (four) times daily. For 10 days (Start date 09/26/15) 09/26/15  Yes Historical Provider, MD  cetirizine (ZYRTEC) 10 MG tablet Take 10 mg by mouth daily.   Yes Historical Provider, MD  sildenafil (VIAGRA) 100 MG tablet Take 25-100 mg by mouth daily as needed for erectile dysfunction.    Yes Historical Provider, MD  chlorpheniramine-HYDROcodone (TUSSIONEX PENNKINETIC ER) 10-8 MG/5ML LQCR Take 5 mLs by mouth every 12 (twelve) hours as needed (cough). Patient not taking: Reported on 09/27/2015 03/11/14   [name], MD  levofloxacin (LEVAQUIN) 500 MG tablet Take 1 tablet (500 mg total) by mouth daily. Patient not taking: Reported on 09/27/2015 03/11/14   [name], MD  LORazepam (ATIVAN) 1 MG tablet Take 1 tablet (1 mg total) by mouth at bedtime. Patient not taking:  Reported on 09/27/2015 03/11/14   [name], MD  oxyCODONE-acetaminophen (PERCOCET) 10-325 MG tablet Take 1 tablet by mouth every 4 (four) hours as needed for pain. Patient not taking: Reported on 09/27/2015 09/02/15   Geoffery Lyonsouglas Delo, MD  predniSONE (DELTASONE) 10 MG tablet Take 2 tablets (20 mg total) by mouth 2 (two) times daily. Patient not taking: Reported on 09/27/2015 09/02/15   Geoffery Lyonsouglas Delo, MD  VALIUM 5 MG tablet Take 1 tablet (5 mg total) by mouth every 6 (six) hours as needed for anxiety (spasms). Patient not taking: Reported on 09/27/2015 09/02/15   Geoffery Lyonsouglas Delo, MD   Dg Chest 2 View  09/27/2015  CLINICAL DATA:  Cough and chest  congestion over the last few days. EXAM: CHEST  2 VIEW COMPARISON:  None. FINDINGS: Heart size is normal. Mediastinal shadows are normal. The lungs are clear. No bronchial thickening. No infiltrate, mass, effusion or collapse. Pulmonary vascularity is normal. No bony abnormality. IMPRESSION: Normal chest Electronically Signed   By: Paulina FusiMark  Shogry M.D.   On: 09/27/2015 16:44   Dg Knee Complete 4 Views Right  09/27/2015  CLINICAL DATA:  Right knee pain and swelling for 2 weeks. No known injury. EXAM: RIGHT KNEE - COMPLETE 4+ VIEW COMPARISON:  None. FINDINGS: No evidence of fracture or dislocation. No evidence of arthropathy or other focal bone abnormality. Possible small suprapatellar joint effusion. Diffuse soft tissue edema appears most prominent at. No soft tissue air. No radiopaque foreign body. IMPRESSION: Soft tissue edema most prominent anteriorly. Small joint effusion. No bony abnormality. Electronically Signed   By: Rubye OaksMelanie  Ehinger M.D.   On: 09/27/2015 18:05   - pertinent xrays, CT, MRI studies were reviewed and independently interpreted  Positive ROS: All other systems have been reviewed and were otherwise negative with the exception of those mentioned in the HPI and as above.  Physical Exam: General: Alert, no acute distress Cardiovascular: No pedal edema Respiratory: No cyanosis, no use of accessory musculature GI: No organomegaly, abdomen is soft and non-tender Skin: Patient has cellulitis involving the anterior aspect of the right knee. Patient has tenderness to palpation in the calf knee and thigh by review of the areas outlined on the leg it appears the cellulitis is improving however patient is extremely sensitive. There is no fluctuance no signs of a prepatellar bursal infection. Patient has no fluctuance within the joint no evidence of a septic joint. Neurologic: Sensation intact distally Psychiatric: Patient is competent for consent with normal mood and affect Lymphatic: No axillary  or cervical lymphadenopathy  MUSCULOSKELETAL:  Objective on examination patient does has cellulitis involving the anterior aspect of the patella as well as anteriorly over the leg and thigh. There is no fluctuant collection or signs of abscess. No signs of prepatellar bursal infection. No signs of an intra-articular joint infection. Patient's elevated white cell count CRP and sedimentation rate.  Assessment: Assessment: Cellulitis left knee without evidence of a prepatellar bursal infection without evidence of an intra-articular septic joint.  Plan: Plan: I would continue the IV antibiotics with vancomycin and Zosyn. If patient does not show improvement would recommend infectious disease consult. I do not see any areas of abscess that could be treated surgically. Would not aspirate the knee joint due to the cellulitis around the knee.   Thank you for the consult and the opportunity to see Troy Ponce  Einar Nolasco, MD [facility] [phone] 7:50 AM

## 2015-09-28 NOTE — Discharge Summary (Signed)
Physician Discharge Summary  Troy Ponce RUE:[id number] DOB: 12-24-1975 DOA: 09/27/2015  PCP: Egbert GaribaldiMillsaps, KIMBERLY M, NP  Admit date: 09/27/2015 Discharge date: 09/28/2015  Admitted From: Home Disposition:  N/A  Recommendations for Outpatient Follow-up:  1. Left AMA.  Brief/Interim Summary: Per admitting MD Troy RidgeJustin A Mcdougald is a 40 y.o. male with medical history significant of allergy, bipolar, tobacco abuse, anxiety, alcohol use, who presents with pain and swelling in the right knee and leg.  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 It has been progressively getting worse. His right knee becomes red. The pain is constantly, severe, nonradiating. His symptoms have worsened since last night. He also had subjective fever and chills. His right leg is erythematous, mildly swelling and painful. He states that was seen by his PCP, who started him with oral Keflex after given one dose of rocephin 2 days ago, without significant help. He states that he tried to use a needle to relief pressures from his right knee without success at home. Patient has nausea, but no vomiting, diarrhea, abdominal pain. No chest pain, shortness breath, cough, symptoms of UTI or unilateral weakness. Of note, pt states that he has chronic back pain due to lumbar disc problem and was scheduled for surgery tomorrow, but patient is unable to get surger due to his infection in his right leg and knee. Patient states that his lower back pains constant, moderate, radiating to right leg posteriorly sometime. No alarm symptoms, such as leg weakness, urinary incontinence or lost control of BM.  ED Course: pt was found to have WBC 17.5, lactate 3.36, temperature 99.8, tachycardia, no tachypnea, blood pressure 99/82, AKI with cre 1.41. CXR is negative. X-ray of right knee showed soft tissue edema most prominent anteriorely, small joint  effusion and no bony abnormality. Pt is placed on tele bed for obs. Ortho, Dr. Lajoyce Cornersuda was consulted by EDP.  Discharge Diagnoses:  Principal Problem:   Cellulitis of knee, right Active Problems:   Bipolar disorder (HCC)   Lumbar disc herniation   Tobacco abuse   Sepsis (HCC)   Right knee pain   Alcohol use   Cellulitis of right lower leg   AKI (acute kidney injury) (HCC)  Pt left AMA before I saw him, I was notified by the nursing staff that he is leaving AMA in AM.  Discharge Instructions     Medication List    ASK your doctor about these medications        albuterol 108 (90 Base) MCG/ACT inhaler  Commonly known as:  PROVENTIL HFA;VENTOLIN HFA  Inhale 2 puffs into the lungs every 4 (four) hours as needed for wheezing or shortness of breath (cough, shortness of breath or wheezing.).     amphetamine-dextroamphetamine 30 MG tablet  Commonly known as:  ADDERALL  Take 30 mg by mouth 2 (two) times daily.     cephALEXin 500 MG capsule  Commonly known as:  KEFLEX  Take 500 mg by mouth 4 (four) times daily. For 10 days (Start date 09/26/15)     cetirizine 10 MG tablet  Commonly known as:  ZYRTEC  Take 10 mg by mouth daily.     chlorpheniramine-HYDROcodone 10-8 MG/5ML Lqcr  Commonly known as:  TUSSIONEX PENNKINETIC ER  Take 5 mLs by mouth every 12 (twelve) hours as needed (cough).     levofloxacin 500 MG tablet  Commonly known as:  LEVAQUIN  Take 1 tablet (500 mg total) by mouth daily.     LORazepam 1 MG tablet  Commonly known as:  ATIVAN  Take 1 tablet (1 mg total) by mouth at bedtime.     oxyCODONE-acetaminophen 10-325 MG tablet  Commonly known as:  PERCOCET  Take 1 tablet by mouth every 4 (four) hours as needed for pain.     predniSONE 10 MG tablet  Commonly known as:  DELTASONE  Take 2 tablets (20 mg total) by mouth 2 (two) times daily.     sildenafil 100 MG tablet  Commonly known as:  VIAGRA  Take 25-100 mg by mouth daily as needed for erectile dysfunction.      VALIUM 5 MG tablet  Generic drug:  diazepam  Take 1 tablet (5 mg total) by mouth every 6 (six) hours as needed for anxiety (spasms).        Allergies  Allergen Reactions  . Other Anaphylaxis    Unknown B/P med (refer to: Marva Panda at [facility]- [address] [phone])   . Broccoli [Brassica Oleracea Italica] Other (See Comments)    "stomach feels like razor blades" if eaten     Consultations:  Ortho, Dr . Lajoyce Corners Specify Physician/Group   Procedures/Studies: Dg Chest 2 View  09/27/2015  CLINICAL DATA:  Cough and chest congestion over the last few days. EXAM: CHEST  2 VIEW COMPARISON:  None. FINDINGS: Heart size is normal. Mediastinal shadows are normal. The lungs are clear. No bronchial thickening. No infiltrate, mass, effusion or collapse. Pulmonary vascularity is normal. No bony abnormality. IMPRESSION: Normal chest Electronically Signed   By: Paulina Fusi M.D.   On: 09/27/2015 16:44   Mr Lumbar Spine Wo Contrast  09/02/2015  CLINICAL DATA:  New onset of back pain beginning last week. Pain extends to the right leg. Brief improvement after steroid treatment but with recurrence of symptoms. EXAM: MRI LUMBAR SPINE WITHOUT CONTRAST TECHNIQUE: Multiplanar, multisequence MR imaging of the lumbar spine was performed. No intravenous contrast was administered. COMPARISON:  None. FINDINGS: Segmentation:  5 lumbar type vertebral bodies. Alignment:  Normal Vertebrae:  No fracture or focal lesion. Conus medullaris: Extends to the L1-2 level and appears normal. Paraspinal and other soft tissues: Normal. Disc levels: No abnormality at L2-3 and above. No degeneration. No bulge or herniation. No stenosis. L3-4: Right foraminal to extra foraminal disc herniation likely to compress the right L3 nerve root. L4-5: Disc degeneration with endplate osteophytes and bulging of the disc. No central canal stenosis. Mild narrowing of the lateral recesses and foramina without visible  neural compression. L5-S1: Disc degeneration with endplate osteophytes and shallow protrusion of the disc centrally and towards the left. This contacts the thecal sac in the left S1 nerve root. The left S1 nerve root could be affected. Mild facet degeneration at this level. IMPRESSION: The gait abnormality appears to be at the L3-4 level were there is a right foraminal to extra foraminal disc herniation likely to compress the right L3 nerve root. Chronic disc degeneration at L4-5 but without definite neural compression. L5-S1: Chronic left-sided disc degeneration at L5-S1 with some potential to affect the left S1 nerve root. Electronically Signed   By: Paulina Fusi M.D.   On: 09/02/2015 09:54   Dg Knee Complete 4 Views Right  09/27/2015  CLINICAL DATA:  Right knee pain and swelling for 2 weeks. No known injury. EXAM: RIGHT KNEE - COMPLETE 4+ VIEW COMPARISON:  None. FINDINGS: No evidence of  fracture or dislocation. No evidence of arthropathy or other focal bone abnormality. Possible small suprapatellar joint effusion. Diffuse soft tissue edema appears most prominent at. No soft tissue air. No radiopaque foreign body. IMPRESSION: Soft tissue edema most prominent anteriorly. Small joint effusion. No bony abnormality. Electronically Signed   By: Rubye OaksMelanie  Ehinger M.D.   On: 09/27/2015 18:05    (Echo, Carotid, EGD, Colonoscopy, ERCP)    Subjective:   Discharge Exam: Filed Vitals:   09/28/15 0500 09/28/15 0541  BP: 99/50   Pulse: 105   Temp: 101.6 F (38.7 C) 100.2 F (37.9 C)  Resp: 20    Filed Vitals:   09/28/15 0400 09/28/15 0500 09/28/15 0541 09/28/15 0542  BP:  99/50    Pulse:  105    Temp: 101.1 F (38.4 C) 101.6 F (38.7 C) 100.2 F (37.9 C)   TempSrc: Oral Oral Oral   Resp:  20    Height:      Weight:    95.936 kg (211 lb 8 oz)  SpO2:  97%     Not examined    The results of significant diagnostics from this hospitalization (including imaging, microbiology, ancillary and  laboratory) are listed below for reference.     Microbiology: Recent Results (from the past 240 hour(s))  Urine culture     Status: None   Collection Time: 09/27/15  6:29 PM  Result Value Ref Range Status   Specimen Description URINE, RANDOM  Final   Special Requests NONE  Final   Culture NO GROWTH  Final   Report Status 09/28/2015 FINAL  Final     Labs: BNP (last 3 results) No results for input(s): BNP in the last 8760 hours. Basic Metabolic Panel:  Recent Labs Lab 09/27/15 1624 09/28/15 0425  NA 135 134*  K 4.2 3.9  CL 101 105  CO2 23 25  GLUCOSE 157* 115*  BUN 24* 17  CREATININE 1.41* 0.94  CALCIUM 9.5 8.2*   Liver Function Tests:  Recent Labs Lab 09/27/15 1624  AST 53*  ALT 52  ALKPHOS 79  BILITOT 1.2  PROT 6.9  ALBUMIN 3.4*   No results for input(s): LIPASE, AMYLASE in the last 168 hours. No results for input(s): AMMONIA in the last 168 hours. CBC:  Recent Labs Lab 09/27/15 1624 09/28/15 0425  WBC 17.5* 13.2*  NEUTROABS 15.9*  --   HGB 13.4 11.3*  HCT 41.0 33.8*  MCV 98.8 99.1  PLT 166 143*   Cardiac Enzymes: No results for input(s): CKTOTAL, CKMB, CKMBINDEX, TROPONINI in the last 168 hours. BNP: Invalid input(s): POCBNP CBG: No results for input(s): GLUCAP in the last 168 hours. D-Dimer No results for input(s): DDIMER in the last 72 hours. Hgb A1c No results for input(s): HGBA1C in the last 72 hours. Lipid Profile No results for input(s): CHOL, HDL, LDLCALC, TRIG, CHOLHDL, LDLDIRECT in the last 72 hours. Thyroid function studies No results for input(s): TSH, T4TOTAL, T3FREE, THYROIDAB in the last 72 hours.  Invalid input(s): FREET3 Anemia work up No results for input(s): VITAMINB12, FOLATE, FERRITIN, TIBC, IRON, RETICCTPCT in the last 72 hours. Urinalysis    Component Value Date/Time   COLORURINE ORANGE* 09/27/2015 1829   APPEARANCEUR CLEAR 09/27/2015 1829   LABSPEC 1.032* 09/27/2015 1829   PHURINE 5.5 09/27/2015 1829    GLUCOSEU NEGATIVE 09/27/2015 1829   HGBUR NEGATIVE 09/27/2015 1829   BILIRUBINUR SMALL* 09/27/2015 1829   KETONESUR 15* 09/27/2015 1829   PROTEINUR NEGATIVE 09/27/2015 1829   NITRITE NEGATIVE 09/27/2015  1829   LEUKOCYTESUR TRACE* 09/27/2015 1829   Sepsis Labs Invalid input(s): PROCALCITONIN,  WBC,  LACTICIDVEN Microbiology Recent Results (from the past 240 hour(s))  Urine culture     Status: None   Collection Time: 09/27/15  6:29 PM  Result Value Ref Range Status   Specimen Description URINE, RANDOM  Final   Special Requests NONE  Final   Culture NO GROWTH  Final   Report Status 09/28/2015 FINAL  Final     Time coordinating discharge: < 30 minutes  SIGNED:   Clint Lipps, MD  [facility] 09/28/2015, 4:34 PM Pager   If 7PM-7AM, please contact night-coverage [email or web address] Password [email or web address]

## 2015-09-28 NOTE — Progress Notes (Signed)
Pt had a fever of 101.6 F, MD notified, rechecked and went to 100.2, blood cx are pending and pt is on Vanco IV and Zosyn IV, will continue to monitor, Thanks Lavonda JumboMike F RN

## 2015-09-28 NOTE — Progress Notes (Addendum)
  Pt's wife has been lying in the bed with the patient. Pt is literally lying on the edge on the bed, and said he has almost felt out of the bed several times.  The wife, who claims she is a Designer, jewelleryregistered nurse, said that she is lying in the bed because she needs to prevent him from falling. I requested that she sleeps in the recliner to allow the patient to move to the middle of the bed to prevent him from falling.  The patient is not allowing me to finish my sentences; he kept interrupting me by constantly saying "but she is a Designer, jewelleryregistered nurse."  When I said that when it comes to the patient's safety, I have to make decisions that are in his best interest including asking her to move to the recliner, the wife said that I "have no bedside manner", I should not ask her to sleep in the recliner (instead of the bed), and said: "Are you saying that it is my fault that he might fall?"  Prior to this event, the patient is not compliant with his NPO status, and has be eating food from "[employer]."  [facility] to talk to the patient and his wife.  Another nurse will be caring for the patient going forward.

## 2015-09-28 NOTE — Progress Notes (Signed)
Notified attending physician of pt's intentions to leave AMA.

## 2015-09-28 NOTE — Progress Notes (Signed)
Pt left AMA, stating he wanted to leave premises to "go smoke."  No assessment charted due to the early timing of his exit.

## 2015-09-28 NOTE — Progress Notes (Signed)
Night shift RN Dellie Burns(Jes) who admitted the patient reminded the patient of his NPO status in my presence around 2330.   Around 0200: Pt asked for something to drink, and he was reminded again by myself that he is not supposed to be eating or drinking.

## 2015-09-28 NOTE — Progress Notes (Signed)
Called by RN about patient's complaints .noted pt's "wife" lying in bed with patient. Pt verbalized "i'm dehydrated". Requesting to drink, explained MD order to be NPO. Called AC to explain MD orders.

## 2015-09-30 ENCOUNTER — Inpatient Hospital Stay (HOSPITAL_COMMUNITY)
Admission: EM | Admit: 2015-09-30 | Discharge: 2015-10-02 | DRG: 603 | Discharge status: Against Medical Advice | Payer: BLUE CROSS/BLUE SHIELD | Attending: Internal Medicine | Admitting: Internal Medicine

## 2015-09-30 ENCOUNTER — Encounter (HOSPITAL_COMMUNITY): Payer: Self-pay | Admitting: Emergency Medicine

## 2015-09-30 ENCOUNTER — Emergency Department (HOSPITAL_COMMUNITY): Payer: BLUE CROSS/BLUE SHIELD

## 2015-09-30 ENCOUNTER — Emergency Department (HOSPITAL_BASED_OUTPATIENT_CLINIC_OR_DEPARTMENT_OTHER)
Admit: 2015-09-30 | Discharge: 2015-09-30 | Disposition: A | Discharge status: Home / Self Care | Payer: BLUE CROSS/BLUE SHIELD | Attending: Nurse Practitioner | Admitting: Nurse Practitioner

## 2015-09-30 DIAGNOSIS — D539 Nutritional anemia, unspecified: Secondary | ICD-10-CM | POA: Diagnosis present

## 2015-09-30 DIAGNOSIS — Y901 Blood alcohol level of 20-39 mg/100 ml: Secondary | ICD-10-CM | POA: Diagnosis present

## 2015-09-30 DIAGNOSIS — F17210 Nicotine dependence, cigarettes, uncomplicated: Secondary | ICD-10-CM | POA: Diagnosis present

## 2015-09-30 DIAGNOSIS — Z7289 Other problems related to lifestyle: Secondary | ICD-10-CM

## 2015-09-30 DIAGNOSIS — S80211D Abrasion, right knee, subsequent encounter: Secondary | ICD-10-CM

## 2015-09-30 DIAGNOSIS — F1010 Alcohol abuse, uncomplicated: Secondary | ICD-10-CM | POA: Diagnosis not present

## 2015-09-30 DIAGNOSIS — L03116 Cellulitis of left lower limb: Secondary | ICD-10-CM

## 2015-09-30 DIAGNOSIS — X58XXXD Exposure to other specified factors, subsequent encounter: Secondary | ICD-10-CM | POA: Diagnosis present

## 2015-09-30 DIAGNOSIS — Z23 Encounter for immunization: Secondary | ICD-10-CM

## 2015-09-30 DIAGNOSIS — Z720 Tobacco use: Secondary | ICD-10-CM | POA: Diagnosis present

## 2015-09-30 DIAGNOSIS — F319 Bipolar disorder, unspecified: Secondary | ICD-10-CM | POA: Diagnosis present

## 2015-09-30 DIAGNOSIS — L03115 Cellulitis of right lower limb: Secondary | ICD-10-CM | POA: Diagnosis not present

## 2015-09-30 DIAGNOSIS — M5126 Other intervertebral disc displacement, lumbar region: Secondary | ICD-10-CM | POA: Diagnosis present

## 2015-09-30 DIAGNOSIS — Z765 Malingerer [conscious simulation]: Secondary | ICD-10-CM

## 2015-09-30 DIAGNOSIS — J302 Other seasonal allergic rhinitis: Secondary | ICD-10-CM | POA: Diagnosis present

## 2015-09-30 LAB — CBC WITH DIFFERENTIAL/PLATELET
Basophils Absolute: 0 10*3/uL (ref 0.0–0.1)
Basophils Relative: 0 %
EOS PCT: 1 %
Eosinophils Absolute: 0.1 10*3/uL (ref 0.0–0.7)
HEMATOCRIT: 37.7 % — AB (ref 39.0–52.0)
Hemoglobin: 12 g/dL — ABNORMAL LOW (ref 13.0–17.0)
LYMPHS ABS: 1.7 10*3/uL (ref 0.7–4.0)
LYMPHS PCT: 18 %
MCH: 32.3 pg (ref 26.0–34.0)
MCHC: 31.8 g/dL (ref 30.0–36.0)
MCV: 101.6 fL — AB (ref 78.0–100.0)
MONO ABS: 1 10*3/uL (ref 0.1–1.0)
MONOS PCT: 11 %
Neutro Abs: 6.8 10*3/uL (ref 1.7–7.7)
Neutrophils Relative %: 70 %
PLATELETS: 197 10*3/uL (ref 150–400)
RBC: 3.71 MIL/uL — ABNORMAL LOW (ref 4.22–5.81)
RDW: 13.6 % (ref 11.5–15.5)
WBC: 9.7 10*3/uL (ref 4.0–10.5)

## 2015-09-30 LAB — RETICULOCYTES
RBC.: 3.58 MIL/uL — ABNORMAL LOW (ref 4.22–5.81)
RETIC COUNT ABSOLUTE: 71.6 10*3/uL (ref 19.0–186.0)
RETIC CT PCT: 2 % (ref 0.4–3.1)

## 2015-09-30 LAB — RAPID URINE DRUG SCREEN, HOSP PERFORMED
AMPHETAMINES: NOT DETECTED
BENZODIAZEPINES: POSITIVE — AB
Barbiturates: NOT DETECTED
Cocaine: NOT DETECTED
OPIATES: POSITIVE — AB
Tetrahydrocannabinol: NOT DETECTED

## 2015-09-30 LAB — PROCALCITONIN: PROCALCITONIN: 0.86 ng/mL

## 2015-09-30 LAB — BASIC METABOLIC PANEL
Anion gap: 9 (ref 5–15)
BUN: 9 mg/dL (ref 6–20)
CHLORIDE: 109 mmol/L (ref 101–111)
CO2: 22 mmol/L (ref 22–32)
Calcium: 8.3 mg/dL — ABNORMAL LOW (ref 8.9–10.3)
Creatinine, Ser: 0.6 mg/dL — ABNORMAL LOW (ref 0.61–1.24)
GFR calc Af Amer: >60 mL/min (ref >60)
GFR calc non Af Amer: >60 mL/min (ref >60)
GLUCOSE: 115 mg/dL — AB (ref 65–99)
POTASSIUM: 3.5 mmol/L (ref 3.5–5.1)
Sodium: 140 mmol/L (ref 135–145)

## 2015-09-30 LAB — IRON AND TIBC
IRON: 35 ug/dL — AB (ref 45–182)
Saturation Ratios: 16 % — ABNORMAL LOW (ref 17.9–39.5)
TIBC: 214 ug/dL — AB (ref 250–450)
UIBC: 179 ug/dL

## 2015-09-30 LAB — SEDIMENTATION RATE: Sed Rate: 76 mm/hr — ABNORMAL HIGH (ref 0–16)

## 2015-09-30 LAB — TSH: TSH: 1.128 u[IU]/mL (ref 0.350–4.500)

## 2015-09-30 LAB — VITAMIN B12: Vitamin B-12: 585 pg/mL (ref 180–914)

## 2015-09-30 LAB — LACTIC ACID, PLASMA: LACTIC ACID, VENOUS: 1.6 mmol/L (ref 0.5–1.9)

## 2015-09-30 LAB — MRSA PCR SCREENING: MRSA by PCR: NEGATIVE

## 2015-09-30 LAB — ETHANOL: ALCOHOL ETHYL (B): 38 mg/dL — AB (ref <5)

## 2015-09-30 LAB — FERRITIN: FERRITIN: 999 ng/mL — AB (ref 24–336)

## 2015-09-30 LAB — CK: CK TOTAL: 44 U/L — AB (ref 49–397)

## 2015-09-30 LAB — FOLATE: Folate: 25.6 ng/mL (ref >5.9)

## 2015-09-30 MED ORDER — VANCOMYCIN HCL 10 G IV SOLR
1750.0000 mg | Freq: Once | INTRAVENOUS | Status: AC
  Administered 2015-09-30: 1750 mg via INTRAVENOUS
  Filled 2015-09-30: qty 1750

## 2015-09-30 MED ORDER — THIAMINE HCL 100 MG/ML IJ SOLN: 100.0000 mg | Freq: Every day | INTRAMUSCULAR | Status: DC

## 2015-09-30 MED ORDER — FOLIC ACID 1 MG PO TABS
1.0000 mg | ORAL_TABLET | Freq: Every day | ORAL | Status: DC
  Administered 2015-09-30 – 2015-10-02 (×3): 1 mg via ORAL
  Filled 2015-09-30 (×2): qty 1

## 2015-09-30 MED ORDER — ENOXAPARIN SODIUM 40 MG/0.4ML ~~LOC~~ SOLN
40.0000 mg | SUBCUTANEOUS | Status: DC
  Administered 2015-09-30 – 2015-10-02 (×3): 40 mg via SUBCUTANEOUS
  Filled 2015-09-30 (×3): qty 0.4

## 2015-09-30 MED ORDER — OXYCODONE HCL 5 MG PO TABS: 5.0000 mg | ORAL_TABLET | ORAL | Status: DC | PRN

## 2015-09-30 MED ORDER — AMPICILLIN-SULBACTAM SODIUM 3 (2-1) G IJ SOLR
3.0000 g | Freq: Three times a day (TID) | INTRAMUSCULAR | Status: DC
  Administered 2015-09-30 – 2015-10-02 (×7): 3 g via INTRAVENOUS
  Filled 2015-09-30 (×9): qty 3

## 2015-09-30 MED ORDER — PIPERACILLIN-TAZOBACTAM 3.375 G IVPB
3.3750 g | Freq: Once | INTRAVENOUS | Status: AC
  Administered 2015-09-30: 3.375 g via INTRAVENOUS
  Filled 2015-09-30: qty 50

## 2015-09-30 MED ORDER — FAMOTIDINE 20 MG PO TABS
20.0000 mg | ORAL_TABLET | Freq: Two times a day (BID) | ORAL | Status: DC
  Administered 2015-09-30 – 2015-10-02 (×6): 20 mg via ORAL
  Filled 2015-09-30 (×6): qty 1

## 2015-09-30 MED ORDER — LORAZEPAM 1 MG PO TABS
1.0000 mg | ORAL_TABLET | Freq: Four times a day (QID) | ORAL | Status: DC | PRN
  Administered 2015-10-02: 1 mg via ORAL
  Filled 2015-09-30: qty 1

## 2015-09-30 MED ORDER — VITAMIN B-1 100 MG PO TABS
100.0000 mg | ORAL_TABLET | Freq: Every day | ORAL | Status: DC
  Administered 2015-09-30 – 2015-10-02 (×3): 100 mg via ORAL
  Filled 2015-09-30 (×3): qty 1

## 2015-09-30 MED ORDER — IOPAMIDOL (ISOVUE-370) INJECTION 76%
INTRAVENOUS | Status: AC
  Filled 2015-09-30: qty 100

## 2015-09-30 MED ORDER — ZOLPIDEM TARTRATE 5 MG PO TABS
10.0000 mg | ORAL_TABLET | Freq: Every evening | ORAL | Status: DC | PRN
  Administered 2015-09-30 – 2015-10-02 (×3): 10 mg via ORAL
  Filled 2015-09-30 (×3): qty 2

## 2015-09-30 MED ORDER — VANCOMYCIN HCL IN DEXTROSE 1-5 GM/200ML-% IV SOLN
1000.0000 mg | Freq: Once | INTRAVENOUS | Status: DC
  Administered 2015-09-30: 1000 mg via INTRAVENOUS
  Filled 2015-09-30: qty 200

## 2015-09-30 MED ORDER — DOCUSATE SODIUM 100 MG PO CAPS
100.0000 mg | ORAL_CAPSULE | Freq: Two times a day (BID) | ORAL | Status: DC
  Administered 2015-10-01 – 2015-10-02 (×4): 100 mg via ORAL
  Filled 2015-09-30 (×4): qty 1

## 2015-09-30 MED ORDER — AMPHETAMINE-DEXTROAMPHETAMINE 10 MG PO TABS
30.0000 mg | ORAL_TABLET | Freq: Two times a day (BID) | ORAL | Status: DC
  Administered 2015-10-01 – 2015-10-02 (×3): 30 mg via ORAL
  Filled 2015-09-30 (×4): qty 3

## 2015-09-30 MED ORDER — LORATADINE 10 MG PO TABS
10.0000 mg | ORAL_TABLET | Freq: Every day | ORAL | Status: DC
  Administered 2015-10-01 – 2015-10-02 (×2): 10 mg via ORAL
  Filled 2015-09-30 (×2): qty 1

## 2015-09-30 MED ORDER — MORPHINE SULFATE (PF) 4 MG/ML IV SOLN
4.0000 mg | INTRAVENOUS | Status: DC | PRN
  Administered 2015-09-30 – 2015-10-02 (×8): 4 mg via INTRAVENOUS
  Filled 2015-09-30 (×8): qty 1

## 2015-09-30 MED ORDER — LORAZEPAM 1 MG PO TABS
0.0000 mg | ORAL_TABLET | Freq: Four times a day (QID) | ORAL | Status: DC
  Administered 2015-09-30: 2 mg via ORAL
  Filled 2015-09-30: qty 2

## 2015-09-30 MED ORDER — ONDANSETRON HCL 4 MG PO TABS: 4.0000 mg | ORAL_TABLET | Freq: Four times a day (QID) | ORAL | Status: DC | PRN

## 2015-09-30 MED ORDER — ADULT MULTIVITAMIN W/MINERALS CH
1.0000 | ORAL_TABLET | Freq: Every day | ORAL | Status: DC
  Administered 2015-09-30 – 2015-10-02 (×3): 1 via ORAL
  Filled 2015-09-30 (×2): qty 1

## 2015-09-30 MED ORDER — ACETAMINOPHEN 650 MG RE SUPP: 650.0000 mg | Freq: Four times a day (QID) | RECTAL | Status: DC | PRN

## 2015-09-30 MED ORDER — LORAZEPAM 1 MG PO TABS: 0.0000 mg | ORAL_TABLET | Freq: Two times a day (BID) | ORAL | Status: DC

## 2015-09-30 MED ORDER — SODIUM CHLORIDE 0.9 % IV SOLN
Freq: Once | INTRAVENOUS | Status: AC
  Administered 2015-09-30: 1000 mL via INTRAVENOUS

## 2015-09-30 MED ORDER — ONDANSETRON HCL 4 MG/2ML IJ SOLN: 4.0000 mg | Freq: Four times a day (QID) | INTRAMUSCULAR | Status: DC | PRN

## 2015-09-30 MED ORDER — MORPHINE SULFATE 15 MG PO TABS
30.0000 mg | ORAL_TABLET | ORAL | Status: DC | PRN
  Administered 2015-09-30 – 2015-10-02 (×9): 30 mg via ORAL
  Filled 2015-09-30 (×10): qty 2

## 2015-09-30 MED ORDER — ALBUTEROL SULFATE (2.5 MG/3ML) 0.083% IN NEBU
2.5000 mg | INHALATION_SOLUTION | RESPIRATORY_TRACT | Status: DC | PRN
  Administered 2015-10-01: 2.5 mg via RESPIRATORY_TRACT
  Filled 2015-09-30: qty 3

## 2015-09-30 MED ORDER — KETOROLAC TROMETHAMINE 15 MG/ML IJ SOLN
15.0000 mg | Freq: Four times a day (QID) | INTRAMUSCULAR | Status: DC
  Administered 2015-09-30 – 2015-10-02 (×10): 15 mg via INTRAVENOUS
  Filled 2015-09-30 (×10): qty 1

## 2015-09-30 MED ORDER — MORPHINE SULFATE (PF) 4 MG/ML IV SOLN
4.0000 mg | Freq: Once | INTRAVENOUS | Status: AC
  Administered 2015-09-30: 4 mg via INTRAVENOUS
  Filled 2015-09-30: qty 1

## 2015-09-30 MED ORDER — IOPAMIDOL (ISOVUE-300) INJECTION 61%
INTRAVENOUS | Status: AC
  Filled 2015-09-30: qty 30

## 2015-09-30 MED ORDER — LORAZEPAM 2 MG/ML IJ SOLN: 1.0000 mg | Freq: Four times a day (QID) | INTRAMUSCULAR | Status: DC | PRN

## 2015-09-30 MED ORDER — NICOTINE 21 MG/24HR TD PT24
21.0000 mg | MEDICATED_PATCH | Freq: Every day | TRANSDERMAL | Status: DC
  Administered 2015-09-30 – 2015-10-02 (×3): 21 mg via TRANSDERMAL
  Filled 2015-09-30 (×3): qty 1

## 2015-09-30 MED ORDER — ACETAMINOPHEN 325 MG PO TABS: 650.0000 mg | ORAL_TABLET | Freq: Four times a day (QID) | ORAL | Status: DC | PRN

## 2015-09-30 MED ORDER — IOPAMIDOL (ISOVUE-300) INJECTION 61%
INTRAVENOUS | Status: AC
  Administered 2015-09-30: 100 mL via INTRAVENOUS
  Filled 2015-09-30: qty 100

## 2015-09-30 MED ORDER — VANCOMYCIN HCL IN DEXTROSE 1-5 GM/200ML-% IV SOLN
1000.0000 mg | Freq: Three times a day (TID) | INTRAVENOUS | Status: DC
  Administered 2015-09-30 – 2015-10-01 (×3): 1000 mg via INTRAVENOUS
  Filled 2015-09-30 (×4): qty 200

## 2015-09-30 NOTE — ED Notes (Signed)
Patient transported to CT 

## 2015-09-30 NOTE — Progress Notes (Signed)
VASCULAR LAB PRELIMINARY  PRELIMINARY  PRELIMINARY  PRELIMINARY  Right lower extremity venous duplex completed.    Preliminary report:  There is no obvious evidence of DVT or SVT noted in the right lower extremity.  This test was limited secondary to patient's inability to withstand pain of compression of vessels.  Test was done completely with color and pulse wave Doppler, however, there is no obvious evidence of DVT noted.   Airica Schwartzkopf, RVT 09/30/2015, 11:26 AM

## 2015-09-30 NOTE — ED Notes (Signed)
Was hospitalized until Friday for right leg infection.  Pt became upset and left AMA.  Is in pain and unable to cope with it.  Will stay this time.

## 2015-09-30 NOTE — ED Provider Notes (Signed)
CSN: [id number]     Arrival date & time 09/30/15  0910 History   First MD Initiated Contact with Patient 09/30/15 [phone]     Chief Complaint  Patient presents with  . Wound Infection     (Consider location/radiation/quality/duration/timing/severity/associated sxs/prior Treatment) HPI Comments: Patient is a 40 year old male with past medical history of bipolar, recently diagnosed lumbar disc herniation, and recent hospitalization for cellulitis of the right leg. He left AGAINST MEDICAL ADVICE 2 nights ago after becoming frustrated with the nurse taking care of him. Since leaving, he has developed increased redness, pain, and swelling to the right leg. He denies any fevers or chills. He denies any nausea or vomiting. He returns requesting readmission and tells me he will stay this time.  The history is provided by the patient.    Past Medical History  Diagnosis Date  . Pneumonia   . Bipolar disorder (HCC)   . Allergy   . Tobacco abuse    Past Surgical History  Procedure Laterality Date  . Appendectomy    . Hernia repair    . Shoulder arthroscopy     Family History  Problem Relation Age of Onset  . Hypertension Mother   . Hypertension Father   . Cancer Maternal Grandmother   . Heart disease Maternal Grandfather   . Cancer Paternal Grandmother   . Diabetes Paternal Grandmother   . Cancer Paternal Grandfather    Social History  Substance Use Topics  . Smoking status: Current Every Day Smoker -- 2.00 packs/day for 30 years    Types: Cigarettes    Start date: 03/11/1984  . Smokeless tobacco: Never Used  . Alcohol Use: 3.6 oz/week    6 Cans of beer per week     Comment: former    Review of Systems  All other systems reviewed and are negative.     Allergies  Other and Broccoli  Home Medications   Prior to Admission medications   Medication Sig Start Date End Date Taking? Authorizing Provider  albuterol (PROVENTIL HFA;VENTOLIN HFA) 108 (90 BASE) MCG/ACT inhaler Inhale  2 puffs into the lungs every 4 (four) hours as needed for wheezing or shortness of breath (cough, shortness of breath or wheezing.). Patient taking differently: Inhale 2 puffs into the lungs every 4 (four) hours as needed for wheezing or shortness of breath.  03/11/14   [name], MD  amphetamine-dextroamphetamine (ADDERALL) 30 MG tablet Take 30 mg by mouth 2 (two) times daily.    Historical Provider, MD  cephALEXin (KEFLEX) 500 MG capsule Take 500 mg by mouth 4 (four) times daily. For 10 days (Start date 09/26/15) 09/26/15   Historical Provider, MD  cetirizine (ZYRTEC) 10 MG tablet Take 10 mg by mouth daily.    Historical Provider, MD  chlorpheniramine-HYDROcodone (TUSSIONEX PENNKINETIC ER) 10-8 MG/5ML LQCR Take 5 mLs by mouth every 12 (twelve) hours as needed (cough). Patient not taking: Reported on 09/27/2015 03/11/14   [name], MD  levofloxacin (LEVAQUIN) 500 MG tablet Take 1 tablet (500 mg total) by mouth daily. Patient not taking: Reported on 09/27/2015 03/11/14   [name], MD  LORazepam (ATIVAN) 1 MG tablet Take 1 tablet (1 mg total) by mouth at bedtime. Patient not taking: Reported on 09/27/2015 03/11/14   [name], MD  oxyCODONE-acetaminophen (PERCOCET) 10-325 MG tablet Take 1 tablet by mouth every 4 (four) hours as needed for pain. Patient not taking: Reported on 09/27/2015 09/02/15   Geoffery Lyonsouglas Carlous Olivares, MD  predniSONE (DELTASONE) 10  MG tablet Take 2 tablets (20 mg total) by mouth 2 (two) times daily. Patient not taking: Reported on 09/27/2015 09/02/15   Geoffery Lyons, MD  sildenafil (VIAGRA) 100 MG tablet Take 25-100 mg by mouth daily as needed for erectile dysfunction.     Historical Provider, MD  VALIUM 5 MG tablet Take 1 tablet (5 mg total) by mouth every 6 (six) hours as needed for anxiety (spasms). Patient not taking: Reported on 09/27/2015 09/02/15   Geoffery Lyons, MD   BP 134/87 mmHg  Temp(Src) 98.3 F (36.8 C) (Oral)  Resp 20  Ht  (1.778 m)  Wt 200 lb (90.719 kg)  BMI  28.70 kg/m2  SpO2 98% Physical Exam  Constitutional: He is oriented to person, place, and time. He appears well-developed and well-nourished. No distress.  HENT:  Head: Normocephalic and atraumatic.  Mouth/Throat: Oropharynx is clear and moist.  Neck: Normal range of motion. Neck supple.  Cardiovascular: Normal rate and regular rhythm.  Exam reveals no friction rub.   No murmur heard. Pulmonary/Chest: Effort normal and breath sounds normal. No respiratory distress. He has no wheezes. He has no rales.  Abdominal: Soft. Bowel sounds are normal. He exhibits no distension. There is no tenderness.  Musculoskeletal: Normal range of motion. He exhibits no edema.  The right lower extremity has erythema from the upper thigh extending all the way into the toes. The swelling and pain is most pronounced in the prepatellar area and the cellulitis seems to extend from here. He has pain with palpation and range of motion. DP pulses are palpable. Sensation and motor are intact to the foot.  Neurological: He is alert and oriented to person, place, and time. Coordination normal.  Skin: Skin is warm and dry. He is not diaphoretic.  Nursing note and vitals reviewed.   ED Course  Procedures (including critical care time) Labs Review Labs Reviewed  BASIC METABOLIC PANEL  CBC WITH DIFFERENTIAL/PLATELET    Imaging Review No results found. I have personally reviewed and evaluated these images and lab results as part of my medical decision-making.   EKG Interpretation None      MDM   Final diagnoses:  None    I have spoken with Revonda Standard from the hospitalist service who will admit the patient. He was given vancomycin and Zosyn in the emergency department.    Geoffery Lyons, MD 09/30/15 1019

## 2015-09-30 NOTE — H&P (Signed)
History and Physical    Troy Ponce HGD:[id number] DOB: [birth year] DOA: 09/30/2015   PCP: [name], NP   Patient coming from/Resides with: Private residence/lives with girlfriend who is a Visual merchandiser Complaint: Increasing pain, swelling and redness to right leg  HPI: Troy Ponce is a 40 y.o. male with medical history significant for bipolar disorder, ??? 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 48 hours prior to seeking treatment in the ER he had been seen by his PCP prescribed oral Keflex and gave one dose of Rocephin but unfortunately patient's symptoms did not improve. He reported subjective fevers and chills at home. He was eventually admitted to the hospital. X-ray done during that admission did demonstrate a small knee joint effusion. He was given empiric vancomycin and Zosyn. He left AMA on 7/7 in the early morning hours after having a disagreement with one of the nurses.  After arriving home he took the Keflex he was previously prescribed. Because he did not have pain medications he increased his alcohol use to treat his pain. Unfortunately pain and swelling increased and is now involving the foot as well as the thigh area. He again reported ejected fevers and chills while at home. He presented to the ER for evaluation and treatment. He verbalizes that he will not leave AMA is admitted.  ED Course:  Vital signs: PO temp 98.3-BP 134/87-pulse 101-respirations 20-room air saturations 98% Lab data: Sodium 140, potassium 3.5, BUN 9, creatinine 0.6, glucose 1:15, CRP 21.7, white count 9700 with normal differential, hemoglobin 12, MCV 102, ESR 45, previous HIV screen negative Medications and  treatment: Morphine 4 mg IV 1 dose, Zosyn 3.375 g IV 1 dose, vancomycin 1750 mg IV 1 dose  Review of Systems:  In addition to the HPI above,  No Headache, changes with Vision or hearing, new weakness, tingling, numbness in any extremity, No problems swallowing food or Liquids, indigestion/reflux No Chest pain, Cough or Shortness of Breath, palpitations, orthopnea or DOE No Abdominal pain, N/V; no melena or hematochezia, no dark tarry stools No dysuria, hematuria or flank pain No new skin rashes, lesions, masses or bruises; note increase in previously documented saline skin changes of right lower extremity-is also develop new vesicular changes in the skin around the knee No new joints pains-aches No recent weight gain or loss No polyuria, polydypsia or polyphagia,   Past Medical History  Diagnosis Date  . Pneumonia   . Bipolar disorder ([place])   . Allergy   . Tobacco abuse     Past Surgical History  Procedure Laterality Date  . Appendectomy    . Hernia repair    . Shoulder arthroscopy      Social History   Social History  . Marital Status: Single    Spouse Name: N/A  . Number of Children: N/A  . Years of Education: N/A   Occupational History  . Not on file.   Social History Main Topics  . Smoking status: Current Every Day Smoker -- 2.00 packs/day for 30 years    Types: Cigarettes    Start date: 03/11/1984  . Smokeless tobacco: Never Used  . Alcohol Use: 3.6 oz/week    6 Cans of beer per week  Comment: former  . Drug Use: No     Comment: adderall  . Sexual Activity: Not on file   Other Topics Concern  . Not on file   Social History Narrative    Mobility: Prior to most recent issue with right lower extremity no assistive devices Work history: Works as Animator   Allergies  Allergen Reactions  . Other Anaphylaxis    Unknown B/P med (refer to: Everardo Beals at [facility] [place] [phone])   . Broccoli [Brassica  Oleracea Italica] Other (See Comments)    "stomach feels like razor blades" if eaten     Family History  Problem Relation Age of Onset  . Hypertension Mother   . Hypertension Father   . Cancer Maternal Grandmother   . Heart disease Maternal Grandfather   . Cancer Paternal Grandmother   . Diabetes Paternal Grandmother   . Cancer Paternal Grandfather      Prior to Admission medications   Medication Sig Start Date End Date Taking? Authorizing Provider  albuterol (PROVENTIL HFA;VENTOLIN HFA) 108 (90 BASE) MCG/ACT inhaler Inhale 2 puffs into the lungs every 4 (four) hours as needed for wheezing or shortness of breath (cough, shortness of breath or wheezing.). Patient taking differently: Inhale 2 puffs into the lungs every 4 (four) hours as needed for wheezing or shortness of breath.  03/11/14   Posey Boyer, MD  amphetamine-dextroamphetamine (ADDERALL) 30 MG tablet Take 30 mg by mouth 2 (two) times daily.    Historical Provider, MD  cephALEXin (KEFLEX) 500 MG capsule Take 500 mg by mouth 4 (four) times daily. For 10 days (Start date 09/26/15) 09/26/15   Historical Provider, MD  cetirizine (ZYRTEC) 10 MG tablet Take 10 mg by mouth daily.    Historical Provider, MD  chlorpheniramine-HYDROcodone (TUSSIONEX PENNKINETIC ER) 10-8 MG/5ML LQCR Take 5 mLs by mouth every 12 (twelve) hours as needed (cough). Patient not taking: Reported on 09/27/2015 03/11/14   Posey Boyer, MD  levofloxacin (LEVAQUIN) 500 MG tablet Take 1 tablet (500 mg total) by mouth daily. Patient not taking: Reported on 09/27/2015 03/11/14   Posey Boyer, MD  LORazepam (ATIVAN) 1 MG tablet Take 1 tablet (1 mg total) by mouth at bedtime. Patient not taking: Reported on 09/27/2015 03/11/14   Posey Boyer, MD  oxyCODONE-acetaminophen (PERCOCET) 10-325 MG tablet Take 1 tablet by mouth every 4 (four) hours as needed for pain. Patient not taking: Reported on 09/27/2015 09/02/15   Veryl Speak, MD  predniSONE (DELTASONE) 10 MG tablet Take  2 tablets (20 mg total) by mouth 2 (two) times daily. Patient not taking: Reported on 09/27/2015 09/02/15   Veryl Speak, MD  sildenafil (VIAGRA) 100 MG tablet Take 25-100 mg by mouth daily as needed for erectile dysfunction.     Historical Provider, MD  VALIUM 5 MG tablet Take 1 tablet (5 mg total) by mouth every 6 (six) hours as needed for anxiety (spasms). Patient not taking: Reported on 09/27/2015 09/02/15   Veryl Speak, MD    Physical Exam: Filed Vitals:   09/30/15 0921  BP: 134/87  Temp: 98.3 F (36.8 C)  TempSrc: Oral  Resp: 20  Height: 5' 10"  (1.778 m)  Weight: 200 lb (90.719 kg)  SpO2: 98%      Constitutional: NAD, calm, comfortable Eyes: PERRL, lids and conjunctivae normal ENMT: Mucous membranes are moist. Posterior pharynx clear of any exudate or lesions.Normal dentition.  Neck: normal, supple, no masses, no thyromegaly Respiratory: clear to auscultation  bilaterally, no wheezing, no crackles. Normal respiratory effort. No accessory muscle use.  Cardiovascular: Regular rate and rhythm, no murmurs / rubs / gallops. No extremity edema. 2+ pedal pulse on left and 1+ on right but difficult to adequately assess due to patient's level of pain and restlessness. No carotid bruits.  Abdomen: no tenderness, no masses palpated. No hepatosplenomegaly. Bowel sounds positive.  Musculoskeletal: no clubbing / cyanosis. No joint deformity upper and lower extremities. Good ROM, no contractures. Normal muscle tone.  Skin: Unremarkable except for progressive cellulitic changes with edema from knee to foot, new vesicular changes around the knee region, pain to palpation disproportionate to expected clinical findings; adequate capillary refill noted (see pictures below) Neurologic: CN 2-12 grossly intact. Sensation intact, DTR normal. Strength 5/5 x all 4 extremities.  Psychiatric: Normal judgment and insight. Alert and oriented x 3. Normal mood.   Pictures of right lower  extremity:       Labs on Admission: I have personally reviewed following labs and imaging studies  CBC:  Recent Labs Lab 09/27/15 1624 09/28/15 0425 09/30/15 0937  WBC 17.5* 13.2* 9.7  NEUTROABS 15.9*  --  6.8  HGB 13.4 11.3* 12.0*  HCT 41.0 33.8* 37.7*  MCV 98.8 99.1 101.6*  PLT 166 143* 009   Basic Metabolic Panel:  Recent Labs Lab 09/27/15 1624 09/28/15 0425 09/30/15 0937  NA 135 134* 140  K 4.2 3.9 3.5  CL 101 105 109  CO2 23 25 22   GLUCOSE 157* 115* 115*  BUN 24* 17 9  CREATININE 1.41* 0.94 0.60*  CALCIUM 9.5 8.2* 8.3*   GFR: Estimated Creatinine Clearance: 136.3 mL/min (by C-G formula based on Cr of 0.6). Liver Function Tests:  Recent Labs Lab 09/27/15 1624  AST 53*  ALT 52  ALKPHOS 79  BILITOT 1.2  PROT 6.9  ALBUMIN 3.4*   No results for input(s): LIPASE, AMYLASE in the last 168 hours. No results for input(s): AMMONIA in the last 168 hours. Coagulation Profile:  Recent Labs Lab 09/27/15 2000  INR 1.25   Cardiac Enzymes: No results for input(s): CKTOTAL, CKMB, CKMBINDEX, TROPONINI in the last 168 hours. BNP (last 3 results) No results for input(s): PROBNP in the last 8760 hours. HbA1C: No results for input(s): HGBA1C in the last 72 hours. CBG: No results for input(s): GLUCAP in the last 168 hours. Lipid Profile: No results for input(s): CHOL, HDL, LDLCALC, TRIG, CHOLHDL, LDLDIRECT in the last 72 hours. Thyroid Function Tests: No results for input(s): TSH, T4TOTAL, FREET4, T3FREE, THYROIDAB in the last 72 hours. Anemia Panel: No results for input(s): VITAMINB12, FOLATE, FERRITIN, TIBC, IRON, RETICCTPCT in the last 72 hours. Urine analysis:    Component Value Date/Time   COLORURINE ORANGE* 09/27/2015 1829   APPEARANCEUR CLEAR 09/27/2015 1829   LABSPEC 1.032* 09/27/2015 1829   PHURINE 5.5 09/27/2015 1829   GLUCOSEU NEGATIVE 09/27/2015 1829   HGBUR NEGATIVE 09/27/2015 1829   BILIRUBINUR SMALL* 09/27/2015 1829   KETONESUR 15*  09/27/2015 1829   PROTEINUR NEGATIVE 09/27/2015 1829   NITRITE NEGATIVE 09/27/2015 1829   LEUKOCYTESUR TRACE* 09/27/2015 1829   Sepsis Labs: @LABRCNTIP (procalcitonin:4,lacticidven:4) ) Recent Results (from the past 240 hour(s))  Culture, blood (Routine x 2)     Status: None (Preliminary result)   Collection Time: 09/27/15  4:21 PM  Result Value Ref Range Status   Specimen Description BLOOD LEFT ANTECUBITAL  Final   Special Requests BOTTLES DRAWN AEROBIC AND ANAEROBIC 5CC  Final   Culture NO GROWTH 2 DAYS  Final  Report Status PENDING  Incomplete  Culture, blood (Routine x 2)     Status: None (Preliminary result)   Collection Time: 09/27/15  5:06 PM  Result Value Ref Range Status   Specimen Description BLOOD RIGHT FOREARM  Final   Special Requests BOTTLES DRAWN AEROBIC AND ANAEROBIC 5CC  Final   Culture NO GROWTH 2 DAYS  Final   Report Status PENDING  Incomplete  Urine culture     Status: None   Collection Time: 09/27/15  6:29 PM  Result Value Ref Range Status   Specimen Description URINE, RANDOM  Final   Special Requests NONE  Final   Culture NO GROWTH  Final   Report Status 09/28/2015 FINAL  Final     Radiological Exams on Admission: No results found.   Assessment/Plan Principal Problem:   Cellulitis of right lower leg -Patient has failed outpatient therapy with oral Keflex noted with progressive cellulitic changes and increasing pain and edema of right lower leg; exam concerning for myositis versus significant soft tissue infection with possible evolving necrosis -Currently no clinical findings of compartment syndrome-neurovascular checks every 4 hours -ESR and CRP elevated as expected -Check CK and lactic acid -Empiric Unasyn and vancomycin; discontinue vancomycin if nasal MRSA screen negative -Blood cultures and Procalcitonin -Given degree of edema need to rule out secondary DVT so check lower extremity venous duplex -Check CT legs to check for abscess formation,  myositis or deep tissue necrosis findings -Pain control with combination of PO narcotics (patient recently on short acting morphine 30 mg every 4 hours so we'll start with this) plus IV morphine -Scheduled IV Toradol with BID Pepcid  Active Problems:   HNP (herniated nucleus pulposus), lumbar -Has been evaluated by Dr. Cyndy Freeze as an outpatient    Alcohol use --Reports at least 2-3 beers several times per week with increased alcohol use after leaving AMA to assist with pain control -Check alcohol level and UDS    Macrocytic anemia -Check anemia panel and TSH    Bipolar disorder  -Interestingly, is not on appropriate medications for this disorder although he does take Adderall but does not appear to carry a formal diagnosis of ADHD    Tobacco abuse -Nicotine patch      DVT prophylaxis: Lovenox  Code Status: Full code  Family Communication: No family at bedside Disposition Plan: Anticipate discharge back to preadmission home environment once medically stable Consults called: None-was evaluated by Dr. Sharol Given in the previous admission but patient requests if orthopedic consult indicated this admission to consult a different physician  Admission status: Observation/medical floor     Leya Paige L. ANP-BC [facility] Pager [phone]   If 7PM-7AM, please contact night-coverage [email or web address] Password [email or web address]  09/30/2015, 10:32 AM

## 2015-09-30 NOTE — Progress Notes (Addendum)
Serum alcohol level 38. Begin CIWA protocol. Lactic acid normal at 1.6, Procalcitonin has decreased from previous admission rating of 3.87-0.86. CK normal at 44. Sedimentation rate further elevated at 76 from 45 during previous admission.  Junious SilkAllison Moranda Billiot, ANP

## 2015-09-30 NOTE — Progress Notes (Signed)
Troy RidgeJustin A Ponce [id number] Admission Data: 09/30/2015 8:03 PM Attending Provider: Joseph ArtJessica U Vann, DO  WUJ:[id number]:Millsaps, Joelene MillinKIMBERLY M, NP Consults/ Treatment Team:    Troy RidgeJustin A Ponce is a 40 y.o. male patient admitted from ED awake, alert  & orientated  X 3,  Full Code, VSS - Blood pressure 130/86, pulse 86, temperature 98.7 F (37.1 C), temperature source Oral, resp. rate 20, height 5\' 10"  (1.778 m), weight 95.573 kg (210 lb 11.2 oz), SpO2 99 %., no c/o shortness of breath, no c/o chest pain, no distress noted.    IV site WDL:  forearm right, condition patent and no redness with a transparent dsg that's clean dry and intact.  Allergies:   Allergies  Allergen Reactions  . Other Anaphylaxis    Unknown B/P med (refer to: Marva PandaKimberly Millsaps at [facility]- [address] [phone])   . Broccoli [Brassica Oleracea Italica] Other (See Comments)    "stomach feels like razor blades" if eaten      Past Medical History  Diagnosis Date  . Pneumonia   . Bipolar disorder (HCC)   . Allergy   . Tobacco abuse     History:  obtained from chart review. Tobacco/alcohol: Smoked 3 packs per day for 20 years history of alcohol abuse years  Pt orientation to unit, room and routine. Information packet given to patient/family and safety video watched.  Admission INP armband ID verified with patient/family, and in place. SR up x 2, fall risk assessment complete with Patient and family verbalizing understanding of risks associated with falls. Pt verbalizes an understanding of how to use the call bell and to call for help before getting out of bed.  Skin, clean-dry- intact without evidence of bruising, or skin tears.   No evidence of skin break down noted on exam. Cellulitis of the right lower extremity with concentration on the knee    Will cont to monitor and assist as needed.  Camillo FlamingVicki L Vestal Crandall, RN 09/30/2015 8:03 PM

## 2015-09-30 NOTE — Progress Notes (Signed)
Pharmacy Antibiotic Note  Troy Ponce is a 40 y.o. male admitted on 09/30/2015 with cellulitis/wound infection of right leg.  Pharmacy has been consulted for Vancomycin and Unasyn dosing.   Day #1 abx for cellulitis. Afebrile and WBC wnl (down from 13.2 to 9.7). Scr 0.6, CrCl > 100 mL/min.   Already received one dose of Zosyn in the ED.   Plan: Give Vancomycin 1.75g x1  Vancomycin 1g IV every 8 hours.  Goal trough 10-15 mcg/mL.  Start Unasyn 3g every 8 hours   Monitor clinical picture, renal function, and vanc trough at steady state.   Follow-up C&S and length of therapy. Consider de-escalation when clinically indicated.   Height: 5\' 10"  (177.8 cm) Weight: 200 lb (90.719 kg) IBW/kg (Calculated) : 73  Temp (24hrs), Avg:98.3 F (36.8 C), Min:98.3 F (36.8 C), Max:98.3 F (36.8 C)   Recent Labs Lab 09/27/15 1624 09/27/15 1644 09/27/15 2000 09/27/15 2128 09/28/15 0425 09/30/15 0937  WBC 17.5*  --   --   --  13.2* 9.7  CREATININE 1.41*  --   --   --  0.94 0.60*  LATICACIDVEN  --  3.36* 1.9 1.3  --   --     Estimated Creatinine Clearance: 136.3 mL/min (by C-G formula based on Cr of 0.6).    Allergies  Allergen Reactions  . Other Anaphylaxis    Unknown B/P med (refer to: Marva PandaKimberly Millsaps at [facility]- [address] [phone])   . Broccoli [Brassica Oleracea Italica] Other (See Comments)    "stomach feels like razor blades" if eaten     Antimicrobials this admission: Unasyn 7/9 >>  Vancomycin 7/9 >>   Dose adjustments this admission: N/A  Microbiology results: 7/6 BCx: NGTD 7/6 UCx: no growth - final    Thank you for allowing pharmacy to be a part of this patient's care.  Cherre HugerKatherine R Cook 09/30/2015 10:27 AM  Pager: [phone]

## 2015-10-01 DIAGNOSIS — Z765 Malingerer [conscious simulation]: Secondary | ICD-10-CM | POA: Diagnosis not present

## 2015-10-01 DIAGNOSIS — Z23 Encounter for immunization: Secondary | ICD-10-CM | POA: Diagnosis not present

## 2015-10-01 DIAGNOSIS — M5126 Other intervertebral disc displacement, lumbar region: Secondary | ICD-10-CM | POA: Diagnosis present

## 2015-10-01 DIAGNOSIS — F1010 Alcohol abuse, uncomplicated: Secondary | ICD-10-CM

## 2015-10-01 DIAGNOSIS — X58XXXD Exposure to other specified factors, subsequent encounter: Secondary | ICD-10-CM | POA: Diagnosis present

## 2015-10-01 DIAGNOSIS — D539 Nutritional anemia, unspecified: Secondary | ICD-10-CM | POA: Diagnosis present

## 2015-10-01 DIAGNOSIS — F17210 Nicotine dependence, cigarettes, uncomplicated: Secondary | ICD-10-CM | POA: Diagnosis present

## 2015-10-01 DIAGNOSIS — L03115 Cellulitis of right lower limb: Secondary | ICD-10-CM | POA: Diagnosis present

## 2015-10-01 DIAGNOSIS — S80211D Abrasion, right knee, subsequent encounter: Secondary | ICD-10-CM | POA: Diagnosis not present

## 2015-10-01 DIAGNOSIS — Y901 Blood alcohol level of 20-39 mg/100 ml: Secondary | ICD-10-CM | POA: Diagnosis present

## 2015-10-01 DIAGNOSIS — F319 Bipolar disorder, unspecified: Secondary | ICD-10-CM | POA: Diagnosis present

## 2015-10-01 DIAGNOSIS — B9689 Other specified bacterial agents as the cause of diseases classified elsewhere: Secondary | ICD-10-CM | POA: Diagnosis not present

## 2015-10-01 DIAGNOSIS — Z7289 Other problems related to lifestyle: Secondary | ICD-10-CM | POA: Diagnosis not present

## 2015-10-01 DIAGNOSIS — J302 Other seasonal allergic rhinitis: Secondary | ICD-10-CM | POA: Diagnosis present

## 2015-10-01 LAB — CBC
HCT: 33.7 % — ABNORMAL LOW (ref 39.0–52.0)
Hemoglobin: 11 g/dL — ABNORMAL LOW (ref 13.0–17.0)
MCH: 32.6 pg (ref 26.0–34.0)
MCHC: 32.6 g/dL (ref 30.0–36.0)
MCV: 100 fL (ref 78.0–100.0)
PLATELETS: 174 10*3/uL (ref 150–400)
RBC: 3.37 MIL/uL — AB (ref 4.22–5.81)
RDW: 13.6 % (ref 11.5–15.5)
WBC: 6.5 10*3/uL (ref 4.0–10.5)

## 2015-10-01 LAB — COMPREHENSIVE METABOLIC PANEL
ALK PHOS: 44 U/L (ref 38–126)
ALT: 40 U/L (ref 17–63)
AST: 45 U/L — ABNORMAL HIGH (ref 15–41)
Albumin: 2.2 g/dL — ABNORMAL LOW (ref 3.5–5.0)
Anion gap: 5 (ref 5–15)
BUN: 11 mg/dL (ref 6–20)
CALCIUM: 8.3 mg/dL — AB (ref 8.9–10.3)
CO2: 26 mmol/L (ref 22–32)
CREATININE: 0.71 mg/dL (ref 0.61–1.24)
Chloride: 108 mmol/L (ref 101–111)
Glucose, Bld: 122 mg/dL — ABNORMAL HIGH (ref 65–99)
Potassium: 3.5 mmol/L (ref 3.5–5.1)
Sodium: 139 mmol/L (ref 135–145)
Total Bilirubin: 0.4 mg/dL (ref 0.3–1.2)
Total Protein: 5.2 g/dL — ABNORMAL LOW (ref 6.5–8.1)

## 2015-10-01 MED ORDER — FOLIC ACID 1 MG PO TABS: 1.0000 mg | ORAL_TABLET | Freq: Every day | ORAL | Status: DC

## 2015-10-01 MED ORDER — THIAMINE HCL 100 MG/ML IJ SOLN: 100.0000 mg | Freq: Every day | INTRAMUSCULAR | Status: DC

## 2015-10-01 MED ORDER — ADULT MULTIVITAMIN W/MINERALS CH: 1.0000 | ORAL_TABLET | Freq: Every day | ORAL | Status: DC

## 2015-10-01 MED ORDER — VITAMIN B-1 100 MG PO TABS: 100.0000 mg | ORAL_TABLET | Freq: Every day | ORAL | Status: DC

## 2015-10-01 MED ORDER — LORAZEPAM 2 MG/ML IJ SOLN: 1.0000 mg | Freq: Four times a day (QID) | INTRAMUSCULAR | Status: DC | PRN

## 2015-10-01 MED ORDER — PNEUMOCOCCAL VAC POLYVALENT 25 MCG/0.5ML IJ INJ
0.5000 mL | INJECTION | INTRAMUSCULAR | Status: AC
  Administered 2015-10-02: 0.5 mL via INTRAMUSCULAR
  Filled 2015-10-01: qty 0.5

## 2015-10-01 MED ORDER — LORAZEPAM 1 MG PO TABS: 1.0000 mg | ORAL_TABLET | Freq: Four times a day (QID) | ORAL | Status: DC | PRN

## 2015-10-01 NOTE — Progress Notes (Signed)
[facility]                                                                              Patient Demographics  Troy Ponce, is a 40 y.o. male, DOB - 08/15/1975, ZOX:[id number]  Admit date - 09/30/2015   Admitting Physician Joseph Art, DO  Outpatient Primary MD for the patient is Millsaps, Joelene Millin, NP  Outpatient specialists:   LOS - 1  days    Chief Complaint  Patient presents with  . Wound Infection       Brief summary   Troy Ponce is a 40 y.o. male with medical history significant for bipolar disorder, ??? 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 48 hours prior to seeking treatment in the ER he had been seen by his PCP prescribed oral Keflex and gave one dose of Rocephin but unfortunately patient's symptoms did not improve. He reported subjective fevers and chills at home. He was eventually admitted to the hospital. X-ray done during that admission did demonstrate a small knee joint effusion. He was given empiric vancomycin and Zosyn. He left AMA on 7/7 in the early morning hours after having a disagreement with one of the nurses. After arriving home he took the Keflex he was previously prescribed. Because he did not have pain medications he increased his alcohol use to treat his pain. Unfortunately pain and swelling increased and is now involving the foot as well as the thigh area. He again reported fevers and chills while at home. He presented to the ER for evaluation and treatment.   Assessment & Plan    Principal Problem:  Cellulitis of right lower leg: Improving -Patient has failed outpatient therapy with oral Keflex noted with progressive cellulitic changes and  increasing pain and edema of right lower leg  -On IV vancomycin and Unasyn. MRSA screen negative, will DC vancomycin  - Follow blood cultures. - CT negative for any myositis or abscess  Pain control/narcotic seeking behavior -Pain control with combination of PO narcotics (patient recently on short acting morphine 30 mg every 4 hours so continue) plus IV morphine -Scheduled IV Toradol with BID Pepcid    HNP (herniated nucleus pulposus), lumbar -Has been evaluated by Dr. Mikal Plane as an outpatient   Alcohol use --Reports at least 2-3 beers several times per week with increased alcohol use after leaving AMA to assist with pain control -Alcohol level elevated at 38, UDS positive for opiates and benzos -  place on alcohol withdrawal protocol   Macrocytic anemia -B12, folate within normal limits, TSH 1.1   Bipolar disorder  -Interestingly, is not on appropriate medications for this disorder although he does take Adderall but does not appear to carry a formal diagnosis of ADHD   Tobacco abuse -Nicotine patch   Code Status: Full code DVT Prophylaxis:  Lovenox  Family Communication: Discussed in detail with the patient, all  imaging results, lab results explained to the patient  Disposition Plan: Hopefully next 24-48 hours  Time Spent in minutes   25 minutes  Procedures:  CT tibia-fibula  Consultants:   None  Antimicrobials:   IV vancomycin 7/9> 7/10  IV Unasyn 7/9   Medications  Scheduled Meds: . amphetamine-dextroamphetamine  30 mg Oral BID  . ampicillin-sulbactam (UNASYN) IV  3 g Intravenous Q8H  . docusate sodium  100 mg Oral BID  . enoxaparin (LOVENOX) injection  40 mg Subcutaneous Q24H  . famotidine  20 mg Oral BID  . folic acid  1 mg Oral Daily  . ketorolac  15 mg Intravenous Q6H  . loratadine  10 mg Oral Daily  . LORazepam  0-4 mg Oral Q6H   Followed by  . [START ON 10/02/2015] LORazepam  0-4 mg Oral Q12H  . multivitamin with minerals  1 tablet Oral Daily   . nicotine  21 mg Transdermal Daily  . thiamine  100 mg Oral Daily   Or  . thiamine  100 mg Intravenous Daily  . vancomycin  1,000 mg Intravenous Q8H   Continuous Infusions:  PRN Meds:.acetaminophen **OR** acetaminophen, albuterol, LORazepam **OR** LORazepam, morphine, morphine injection, ondansetron **OR** ondansetron (ZOFRAN) IV, zolpidem   Antibiotics   Anti-infectives    Start     Dose/Rate Route Frequency Ordered Stop   09/30/15 2000  vancomycin (VANCOCIN) IVPB 1000 mg/200 mL premix     1,000 mg 200 mL/hr over 60 Minutes Intravenous Every 8 hours 09/30/15 1050     09/30/15 1600  Ampicillin-Sulbactam (UNASYN) 3 g in sodium chloride 0.9 % 100 mL IVPB     3 g 100 mL/hr over 60 Minutes Intravenous Every 8 hours 09/30/15 1050     09/30/15 1100  vancomycin (VANCOCIN) 1,750 mg in sodium chloride 0.9 % 500 mL IVPB     1,750 mg 250 mL/hr over 120 Minutes Intravenous  Once 09/30/15 1034 09/30/15 1933   09/30/15 0945  vancomycin (VANCOCIN) IVPB 1000 mg/200 mL premix  Status:  Discontinued     1,000 mg 200 mL/hr over 60 Minutes Intravenous  Once 09/30/15 0931 09/30/15 1204   09/30/15 0945  piperacillin-tazobactam (ZOSYN) IVPB 3.375 g     3.375 g 12.5 mL/hr over 240 Minutes Intravenous  Once 09/30/15 0931 09/30/15 1346        Subjective:   Troy Ponce was seen and examined today.  Patient seen and examined, right lower extent of the cellulitis significant improving. Pain currently controlled. Patient denies dizziness, chest pain, shortness of breath, abdominal pain, N/V/D/C, new weakness, numbess, tingling. No acute events overnight.    Objective:   Filed Vitals:   09/30/15 1325 09/30/15 2118 10/01/15 0016 10/01/15 0504  BP: 130/86 135/87 128/81 133/84  Pulse: 86 86 80 76  Temp: 98.7 F (37.1 C) 98.2 F (36.8 C) 98.6 F (37 C) 98.2 F (36.8 C)  TempSrc: Oral     Resp: 20 19 15 16   Height: 5\' 10"  (1.778 m)     Weight: 95.573 kg (210 lb 11.2 oz)     SpO2: 99% 99% 100%  98%    Intake/Output Summary (Last 24 hours) at 10/01/15 1327 Last data filed at 10/01/15 1256  Gross per 24 hour  Intake   2042 ml  Output    600 ml  Net   1442 ml     Wt Readings from Last 3 Encounters:  09/30/15 95.573 kg (210 lb 11.2 oz)  09/28/15 95.936 kg (211 lb 8  oz)  03/11/14 93.611 kg (206 lb 6 oz)     Exam  General: Alert and oriented x 3, NAD  HEENT:  PERRLA, EOMI, Anicteric Sclera, mucous membranes moist.   Neck: Supple, no JVD  Cardiovascular: S1 S2 auscultated, no rubs, murmurs or gallops. Regular rate and rhythm.  Respiratory: Clear to auscultation bilaterally, no wheezing, rales or rhonchi  Gastrointestinal: Soft, nontender, nondistended, + bowel sounds  Ext: no cyanosis clubbing or edema  Neuro: AAOx3, Cr N's II- XII. Strength 5/5 upper and lower extremities bilaterally  Skin: Right lower extremity cellulitic changes with edema from knee to foot, improving   Psych: Normal affect and demeanor, alert and oriented x3    Data Reviewed:  I have personally reviewed following labs and imaging studies  Micro Results Recent Results (from the past 240 hour(s))  Culture, blood (Routine x 2)     Status: None (Preliminary result)   Collection Time: 09/27/15  4:21 PM  Result Value Ref Range Status   Specimen Description BLOOD LEFT ANTECUBITAL  Final   Special Requests BOTTLES DRAWN AEROBIC AND ANAEROBIC 5CC  Final   Culture NO GROWTH 4 DAYS  Final   Report Status PENDING  Incomplete  Culture, blood (Routine x 2)     Status: None (Preliminary result)   Collection Time: 09/27/15  5:06 PM  Result Value Ref Range Status   Specimen Description BLOOD RIGHT FOREARM  Final   Special Requests BOTTLES DRAWN AEROBIC AND ANAEROBIC 5CC  Final   Culture NO GROWTH 4 DAYS  Final   Report Status PENDING  Incomplete  Urine culture     Status: None   Collection Time: 09/27/15  6:29 PM  Result Value Ref Range Status   Specimen Description URINE, RANDOM  Final   Special  Requests NONE  Final   Culture NO GROWTH  Final   Report Status 09/28/2015 FINAL  Final  Culture, blood (Routine X 2) w Reflex to ID Panel     Status: None (Preliminary result)   Collection Time: 09/30/15 10:56 AM  Result Value Ref Range Status   Specimen Description BLOOD LEFT ANTECUBITAL  Final   Special Requests BOTTLES DRAWN AEROBIC AND ANAEROBIC  5CC  Final   Culture NO GROWTH < 24 HOURS  Final   Report Status PENDING  Incomplete  Culture, blood (Routine X 2) w Reflex to ID Panel     Status: None (Preliminary result)   Collection Time: 09/30/15 11:03 AM  Result Value Ref Range Status   Specimen Description BLOOD LEFT HAND  Final   Special Requests BOTTLES DRAWN AEROBIC AND ANAEROBIC  5CC  Final   Culture NO GROWTH < 24 HOURS  Final   Report Status PENDING  Incomplete  MRSA PCR Screening     Status: None   Collection Time: 09/30/15  5:15 PM  Result Value Ref Range Status   MRSA by PCR NEGATIVE NEGATIVE Final    Comment:        The GeneXpert MRSA Assay (FDA approved for NASAL specimens only), is one component of a comprehensive MRSA colonization surveillance program. It is not intended to diagnose MRSA infection nor to guide or monitor treatment for MRSA infections.     Radiology Reports Dg Chest 2 View  09/27/2015  CLINICAL DATA:  Cough and chest congestion over the last few days. EXAM: CHEST  2 VIEW COMPARISON:  None. FINDINGS: Heart size is normal. Mediastinal shadows are normal. The lungs are clear. No bronchial thickening. No infiltrate, mass,  effusion or collapse. Pulmonary vascularity is normal. No bony abnormality. IMPRESSION: Normal chest Electronically Signed   By: Paulina FusiMark  Shogry M.D.   On: 09/27/2015 16:44   Mr Lumbar Spine Wo Contrast  09/02/2015  CLINICAL DATA:  New onset of back pain beginning last week. Pain extends to the right leg. Brief improvement after steroid treatment but with recurrence of symptoms. EXAM: MRI LUMBAR SPINE WITHOUT CONTRAST TECHNIQUE:  Multiplanar, multisequence MR imaging of the lumbar spine was performed. No intravenous contrast was administered. COMPARISON:  None. FINDINGS: Segmentation:  5 lumbar type vertebral bodies. Alignment:  Normal Vertebrae:  No fracture or focal lesion. Conus medullaris: Extends to the L1-2 level and appears normal. Paraspinal and other soft tissues: Normal. Disc levels: No abnormality at L2-3 and above. No degeneration. No bulge or herniation. No stenosis. L3-4: Right foraminal to extra foraminal disc herniation likely to compress the right L3 nerve root. L4-5: Disc degeneration with endplate osteophytes and bulging of the disc. No central canal stenosis. Mild narrowing of the lateral recesses and foramina without visible neural compression. L5-S1: Disc degeneration with endplate osteophytes and shallow protrusion of the disc centrally and towards the left. This contacts the thecal sac in the left S1 nerve root. The left S1 nerve root could be affected. Mild facet degeneration at this level. IMPRESSION: The gait abnormality appears to be at the L3-4 level were there is a right foraminal to extra foraminal disc herniation likely to compress the right L3 nerve root. Chronic disc degeneration at L4-5 but without definite neural compression. L5-S1: Chronic left-sided disc degeneration at L5-S1 with some potential to affect the left S1 nerve root. Electronically Signed   By: Paulina FusiMark  Shogry M.D.   On: 09/02/2015 09:54   Ct Tibia Fibula Right W Contrast  09/30/2015  CLINICAL DATA:  RIGHT lower extremity redness and swelling. History of cellulitis with IV antibiotic therapy initiated and potential premature termination of antibiotic therapy EXAM: CT OF THE RIGHT TIBIA AND FIBULA WITH CONTRAST TECHNIQUE: CT of the RIGHT lower extremity. CONTRAST:  1 ISOVUE-300 IOPAMIDOL (ISOVUE-300) INJECTION 61% COMPARISON:  Plain films 09/27/2015 FINDINGS: There is haziness within the subcutaneous fat anterior to the tibia extending from the  level patella to the distal tibia above the ankle mortise. There is linear streaking within the subcutaneous tissue consistent with inflammation/cellulitis. There is no organized fluid collection. The inflammation appears superficial to muscular compartment. No intramuscular abscess or abnormal enhancement. There is no evidence of bony erosion to suggest osteomyelitis. Small suprapatellar joint effusion. IMPRESSION: 1. Cellulitis along the anterior aspect of the RIGHT lower extremity. 2. No evidence of subcutaneous abscess or gas collection. 3. No evidence of muscular abscess. 4. No evidence of osteo myelitis. Electronically Signed   By: Genevive BiStewart  Edmunds M.D.   On: 09/30/2015 12:24   Dg Knee Complete 4 Views Right  09/27/2015  CLINICAL DATA:  Right knee pain and swelling for 2 weeks. No known injury. EXAM: RIGHT KNEE - COMPLETE 4+ VIEW COMPARISON:  None. FINDINGS: No evidence of fracture or dislocation. No evidence of arthropathy or other focal bone abnormality. Possible small suprapatellar joint effusion. Diffuse soft tissue edema appears most prominent at. No soft tissue air. No radiopaque foreign body. IMPRESSION: Soft tissue edema most prominent anteriorly. Small joint effusion. No bony abnormality. Electronically Signed   By: Rubye OaksMelanie  Ehinger M.D.   On: 09/27/2015 18:05    Lab Data:  CBC:  Recent Labs Lab 09/27/15 1624 09/28/15 0425 09/30/15 0937 10/01/15 0624  WBC 17.5* 13.2*  9.7 6.5  NEUTROABS 15.9*  --  6.8  --   HGB 13.4 11.3* 12.0* 11.0*  HCT 41.0 33.8* 37.7* 33.7*  MCV 98.8 99.1 101.6* 100.0  PLT 166 143* 197 174   Basic Metabolic Panel:  Recent Labs Lab 09/27/15 1624 09/28/15 0425 09/30/15 0937 10/01/15 0624  NA 135 134* 140 139  K 4.2 3.9 3.5 3.5  CL 101 105 109 108  CO2 23 25 22 26   GLUCOSE 157* 115* 115* 122*  BUN 24* 17 9 11   CREATININE 1.41* 0.94 0.60* 0.71  CALCIUM 9.5 8.2* 8.3* 8.3*   GFR: Estimated Creatinine Clearance: 139.5 mL/min (by C-G formula based on  Cr of 0.71). Liver Function Tests:  Recent Labs Lab 09/27/15 1624 10/01/15 0624  AST 53* 45*  ALT 52 40  ALKPHOS 79 44  BILITOT 1.2 0.4  PROT 6.9 5.2*  ALBUMIN 3.4* 2.2*   No results for input(s): LIPASE, AMYLASE in the last 168 hours. No results for input(s): AMMONIA in the last 168 hours. Coagulation Profile:  Recent Labs Lab 09/27/15 2000  INR 1.25   Cardiac Enzymes:  Recent Labs Lab 09/30/15 1106  CKTOTAL 44*   BNP (last 3 results) No results for input(s): PROBNP in the last 8760 hours. HbA1C: No results for input(s): HGBA1C in the last 72 hours. CBG: No results for input(s): GLUCAP in the last 168 hours. Lipid Profile: No results for input(s): CHOL, HDL, LDLCALC, TRIG, CHOLHDL, LDLDIRECT in the last 72 hours. Thyroid Function Tests:  Recent Labs  09/30/15 1056  TSH 1.128   Anemia Panel:  Recent Labs  09/30/15 1107  VITAMINB12 585  FOLATE 25.6  FERRITIN 999*  TIBC 214*  IRON 35*  RETICCTPCT 2.0   Urine analysis:    Component Value Date/Time   COLORURINE ORANGE* 09/27/2015 1829   APPEARANCEUR CLEAR 09/27/2015 1829   LABSPEC 1.032* 09/27/2015 1829   PHURINE 5.5 09/27/2015 1829   GLUCOSEU NEGATIVE 09/27/2015 1829   HGBUR NEGATIVE 09/27/2015 1829   BILIRUBINUR SMALL* 09/27/2015 1829   KETONESUR 15* 09/27/2015 1829   PROTEINUR NEGATIVE 09/27/2015 1829   NITRITE NEGATIVE 09/27/2015 1829   LEUKOCYTESUR TRACE* 09/27/2015 1829     Desere Gwin M.D. [facility] 10/01/2015, 1:27 PM  Pager: [phone] Between 7am to 7pm - call Pager - [phone]  After 7pm go to [email or web address] - password [email or web address]  Call night coverage person covering after 7pm

## 2015-10-01 NOTE — Progress Notes (Signed)
RN was called to room because NT stated she smelled smoke in room.  When RN entered the room, pt. Was up moving around, putting on his clothes, stating "my leg and pain is getting worst, I need to see MD right now."  Paged Merdis DelayK. Schorr, NP and informed of above.  Return call back from K. Schorr, NP, and instructed to mark pt. Leg around redness to monitor and call her back if there is any increase redness.  RN asked pt. If he was smoking in the room, he stated "no, it was the other night and we got that straight, there is no need to be concerned about me smoking, I'm more concerned about my leg.  I don't want to lose my leg.  Informed pt. That this is a no smoking facility and if he had any cigarettes or lighter, if I could get them until his was discharged if he wasn't going to smoke.  Pt. Gave RN his pack of cigarettes, which had 10 cigarettes and lighter, items placed in bag and left in front of chart.  Leg marked as instructed.  Also during our conversation, pt. Said, that a male had call his room, stating I know you have cellulitis and what is that?  The male said that I know a lot of nurses up there and can call around and find out information.  Pt. Stated, "how could someone call and say things like that if my HIPPA information wasn't violated.  RN informed him that we do not give out any medical information, but anyone can call the operator and find out what room you are in. Will continue to monitor.  Forbes Cellarelcine [place] Borchardt, RN

## 2015-10-02 DIAGNOSIS — M5126 Other intervertebral disc displacement, lumbar region: Secondary | ICD-10-CM | POA: Diagnosis not present

## 2015-10-02 DIAGNOSIS — B9689 Other specified bacterial agents as the cause of diseases classified elsewhere: Secondary | ICD-10-CM

## 2015-10-02 DIAGNOSIS — L03115 Cellulitis of right lower limb: Secondary | ICD-10-CM | POA: Diagnosis not present

## 2015-10-02 DIAGNOSIS — F1010 Alcohol abuse, uncomplicated: Secondary | ICD-10-CM | POA: Diagnosis not present

## 2015-10-02 LAB — CBC
HCT: 34.2 % — ABNORMAL LOW (ref 39.0–52.0)
HEMOGLOBIN: 11.1 g/dL — AB (ref 13.0–17.0)
MCH: 32.1 pg (ref 26.0–34.0)
MCHC: 32.5 g/dL (ref 30.0–36.0)
MCV: 98.8 fL (ref 78.0–100.0)
PLATELETS: 204 10*3/uL (ref 150–400)
RBC: 3.46 MIL/uL — AB (ref 4.22–5.81)
RDW: 13.5 % (ref 11.5–15.5)
WBC: 7.6 10*3/uL (ref 4.0–10.5)

## 2015-10-02 LAB — BASIC METABOLIC PANEL
ANION GAP: 5 (ref 5–15)
BUN: 8 mg/dL (ref 6–20)
CHLORIDE: 104 mmol/L (ref 101–111)
CO2: 30 mmol/L (ref 22–32)
Calcium: 8.5 mg/dL — ABNORMAL LOW (ref 8.9–10.3)
Creatinine, Ser: 0.68 mg/dL (ref 0.61–1.24)
GFR calc Af Amer: >60 mL/min (ref >60)
Glucose, Bld: 104 mg/dL — ABNORMAL HIGH (ref 65–99)
POTASSIUM: 3.8 mmol/L (ref 3.5–5.1)
SODIUM: 139 mmol/L (ref 135–145)

## 2015-10-02 LAB — CULTURE, BLOOD (ROUTINE X 2)
CULTURE: NO GROWTH
Culture: NO GROWTH

## 2015-10-02 LAB — GLUCOSE, CAPILLARY: Glucose-Capillary: 103 mg/dL — ABNORMAL HIGH (ref 65–99)

## 2015-10-02 LAB — PROCALCITONIN: Procalcitonin: 0.48 ng/mL

## 2015-10-02 MED ORDER — VANCOMYCIN HCL IN DEXTROSE 1-5 GM/200ML-% IV SOLN
1000.0000 mg | Freq: Three times a day (TID) | INTRAVENOUS | Status: DC
  Administered 2015-10-02: 1000 mg via INTRAVENOUS
  Filled 2015-10-02 (×3): qty 200

## 2015-10-02 NOTE — Progress Notes (Signed)
Patient states that his morphine q2 is not working and he would like this RN to page the MD. Patient also states that his foot has gotten worse and would like to be put on a different abx. RN will let MD know.

## 2015-10-02 NOTE — Progress Notes (Signed)
Pharmacy Antibiotic Note  Troy Ponce is a 40 y.o. male admitted on 09/30/2015 with cellulitis/wound infection of right leg.  Pharmacy has been consulted for Vancomycin  Day#3 abx for cellulitis/wound infxn of R leg. WBC 13.2 > 6.5, afebrile, PCT 0.86. SCr 0.6 > 0.68, CrCl > 100. ID recommends stopping Unasyn and continuing on vancomycin for bursitis. Last dose of vanc was yesterday at 1100, most likely cleared that but will not give another load dose just in case  Plan: Restart vancomycin 1g IV Q8 Stop Unasyn per ID Monitor clinical picture, renal function, VT at Css F/U C&S, abx deescalation / LOT   Height: 5\' 10"  (177.8 cm) Weight: 210 lb 11.2 oz (95.573 kg) IBW/kg (Calculated) : 73  Temp (24hrs), Avg:98.9 F (37.2 C), Min:98.3 F (36.8 C), Max:99.4 F (37.4 C)   Recent Labs Lab 09/27/15 1624 09/27/15 1644 09/27/15 2000 09/27/15 2128 09/28/15 0425 09/30/15 0937 09/30/15 1106 10/01/15 0624 10/02/15 0656  WBC 17.5*  --   --   --  13.2* 9.7  --  6.5 7.6  CREATININE 1.41*  --   --   --  0.94 0.60*  --  0.71 0.68  LATICACIDVEN  --  3.36* 1.9 1.3  --   --  1.6  --   --     Estimated Creatinine Clearance: 139.5 mL/min (by C-G formula based on Cr of 0.68).    Allergies  Allergen Reactions  . Other Anaphylaxis    Unknown B/P med (refer to: Mar[facility]anda[facility]berly [facility]aps [facility] [address] 940 019 [phone]) 10/02/15: [facility] to try to clarify allergy. They did not have record of any BP medications. K. Millsaps not working so unable to speak to her.  Sharon Mt. Broccoli [Brassica Oleracea Italica] Other (See Comments)    "stomach feels like razor blades" if eaten     Antimicrobials this admission: 7/9 Vanc >> 7/10; 7/11 >> 7/9 Unasyn >> 7/11  Dose adjustments this admission: N/A  Microbiology results: 7/9 MRSA neg 7/9 BCx > NG<24h 7/6 BCx > NGx4D 7/6 UCx: NGF  Thank you for allowing pharmacy to be a part of this patient's care.  [name]  [name], PharmD, BCPS Clinical Pharmacist Pager [phone] 10/02/2015 4:21 PM

## 2015-10-02 NOTE — Consult Note (Signed)
[facility]    Date of Admission:  09/30/2015    Total days of antibiotics 8        Day 3 ampicillin sulbactam              Reason for Consult: Persistent right leg cellulitis    Referring Physician: Dr. Thad Ranger  Principal Problem:   Cellulitis of right lower leg Active Problems:   Bipolar disorder (HCC)   HNP (herniated nucleus pulposus), lumbar   Tobacco abuse   Alcohol use   Macrocytic anemia   . amphetamine-dextroamphetamine  30 mg Oral BID  . ampicillin-sulbactam (UNASYN) IV  3 g Intravenous Q8H  . docusate sodium  100 mg Oral BID  . enoxaparin (LOVENOX) injection  40 mg Subcutaneous Q24H  . famotidine  20 mg Oral BID  . folic acid  1 mg Oral Daily  . ketorolac  15 mg Intravenous Q6H  . loratadine  10 mg Oral Daily  . multivitamin with minerals  1 tablet Oral Daily  . nicotine  21 mg Transdermal Daily  . thiamine  100 mg Oral Daily   Or  . thiamine  100 mg Intravenous Daily    Recommendations: 1. Change ampicillin sulbactam to vancomycin   Assessment: He has cellulitis and possible early prepatellar bursitis. It looks like, overall, his cellulitis is better given that it is receding. He has defervesced. However the area over his kneecap seems to have gotten worse. This may be because he has some smoldering prepatellar bursitis. I would prefer to have him on vancomycin alone to cover staph including MRSA and strep which are the most common causes of skin infection and bursitis. If he fails to improve I would ask Dr. Lajoyce Corners to reevaluate. I will follow-up tomorrow.    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 He states that he always has abrasions on his knees. He recently removed a wooden splinter from just below his right kneecap and squeeze some blood out. The day after that he started developing redness, pain and swelling around his right knee. He was seen at his primary care  doctor's office and given an IM injection of ceftriaxone and started on cephalexin but failed to improve and was admitted on 09/28/2015. He was treated with vancomycin and piperacillin tazobactam but left AMA the following day because he did not feel like his pain was being well controlled. He was readmitted on 09/30/2015. He got 1 dose of vancomycin and piperacillin tazobactam then was switched to ampicillin sulbactam. He states that his redness, pain and swelling over his right kneecap has gotten much worse over the past 24 hours.   Review of Systems: Review of Systems  Constitutional: Positive for fever, chills and malaise/fatigue. Negative for weight loss and diaphoresis.  HENT: Negative for sore throat.   Respiratory: Negative for cough, sputum production and shortness of breath.   Cardiovascular: Negative for chest pain.  Gastrointestinal: Negative for heartburn, nausea, vomiting, abdominal pain and diarrhea.  Genitourinary: Negative for dysuria.  Musculoskeletal: Positive for joint pain.  Neurological: Negative for headaches.  Psychiatric/Behavioral: Positive for substance abuse.    Past Medical History  Diagnosis Date  . Pneumonia   . Bipolar disorder (HCC)   . Allergy   . Tobacco abuse     Social History  Substance Use Topics  . Smoking status: Current Every Day Smoker -- 2.00  packs/day for 30 years    Types: Cigarettes    Start date: 03/11/1984  . Smokeless tobacco: Never Used  . Alcohol Use: 3.6 oz/week    6 Cans of beer per week     Comment: former    Family History  Problem Relation Age of Onset  . Hypertension Mother   . Hypertension Father   . Cancer Maternal Grandmother   . Heart disease Maternal Grandfather   . Cancer Paternal Grandmother   . Diabetes Paternal Grandmother   . Cancer Paternal Grandfather    Allergies  Allergen Reactions  . Other Anaphylaxis    Unknown B/P med (refer to: Mar[facility]berly [facility] [address]  [phone]) 10/02/15: [facility] [place] to try to clarify allergy. They did not have record of any BP medications. K. Millsaps not working so unable to speak to her.  Cy Blamer. Broccoli [Brassica Oleracea Italica] Other (See Comments)    "stomach feels like razor blades" if eaten     OBJECTIVE: Blood pressure 151/82, pulse 77, temperature 98.6 F (37 C), temperature source Oral, resp. rate 17, height 5\' 10"  (1.778 m), weight 210 lb 11.2 oz (95.573 kg), SpO2 100 %.  Physical Exam  Constitutional: He is oriented to person, place, and time.  He is sitting on the side of his bed. He is in no distress. His girlfriend is visiting.  HENT:  Mouth/Throat: No oropharyngeal exudate.  Cardiovascular: Normal rate and regular rhythm.   No murmur heard. Pulmonary/Chest: Effort normal and breath sounds normal.  Abdominal: Soft. There is no tenderness.  Neurological: He is alert and oriented to person, place, and time.  Skin:  He has redness and swelling over his right kneecap. It is very tender to palpation. The redness on his lower leg and foot have receded from the inked margins.  Psychiatric: Mood and affect normal.    Lab Results Lab Results  Component Value Date   WBC 7.6 10/02/2015   HGB 11.1* 10/02/2015   HCT 34.2* 10/02/2015   MCV 98.8 10/02/2015   PLT 204 10/02/2015    Lab Results  Component Value Date   CREATININE 0.68 10/02/2015   BUN 8 10/02/2015   NA 139 10/02/2015   K 3.8 10/02/2015   CL 104 10/02/2015   CO2 30 10/02/2015    Lab Results  Component Value Date   ALT 40 10/01/2015   AST 45* 10/01/2015   ALKPHOS 44 10/01/2015   BILITOT 0.4 10/01/2015     Microbiology: Recent Results (from the past 240 hour(s))  Culture, blood (Routine x 2)     Status: None   Collection Time: 09/27/15  4:21 PM  Result Value Ref Range Status   Specimen Description BLOOD LEFT ANTECUBITAL  Final   Special Requests BOTTLES DRAWN AEROBIC AND ANAEROBIC 5CC  Final   Culture NO GROWTH 5 DAYS   Final   Report Status 10/02/2015 FINAL  Final  Culture, blood (Routine x 2)     Status: None   Collection Time: 09/27/15  5:06 PM  Result Value Ref Range Status   Specimen Description BLOOD RIGHT FOREARM  Final   Special Requests BOTTLES DRAWN AEROBIC AND ANAEROBIC 5CC  Final   Culture NO GROWTH 5 DAYS  Final   Report Status 10/02/2015 FINAL  Final  Urine culture     Status: None   Collection Time: 09/27/15  6:29 PM  Result Value Ref Range Status   Specimen Description URINE, RANDOM  Final  Special Requests NONE  Final   Culture NO GROWTH  Final   Report Status 09/28/2015 FINAL  Final  Culture, blood (Routine X 2) w Reflex to ID Panel     Status: None (Preliminary result)   Collection Time: 09/30/15 10:56 AM  Result Value Ref Range Status   Specimen Description BLOOD LEFT ANTECUBITAL  Final   Special Requests BOTTLES DRAWN AEROBIC AND ANAEROBIC  5CC  Final   Culture NO GROWTH 2 DAYS  Final   Report Status PENDING  Incomplete  Culture, blood (Routine X 2) w Reflex to ID Panel     Status: None (Preliminary result)   Collection Time: 09/30/15 11:03 AM  Result Value Ref Range Status   Specimen Description BLOOD LEFT HAND  Final   Special Requests BOTTLES DRAWN AEROBIC AND ANAEROBIC  5CC  Final   Culture NO GROWTH 2 DAYS  Final   Report Status PENDING  Incomplete  MRSA PCR Screening     Status: None   Collection Time: 09/30/15  5:15 PM  Result Value Ref Range Status   MRSA by PCR NEGATIVE NEGATIVE Final    Comment:        The GeneXpert MRSA Assay (FDA approved for NASAL specimens only), is one component of a comprehensive MRSA colonization surveillance program. It is not intended to diagnose MRSA infection nor to guide or monitor treatment for MRSA infections.     Cliffton Asters, MD [facility] [phone] pager   [phone] cell 10/02/2015, 4:03 PM

## 2015-10-02 NOTE — Progress Notes (Signed)
[facility]                                                                              Patient Demographics  Troy Ponce, is a 40 y.o. male, DOB - 05/31/1975, [id number]  Admit date - 09/30/2015   Admitting Physician Joseph ArtJessica U Vann, DO  Outpatient Primary MD for the patient is Millsaps, Joelene MillinKIMBERLY M, NP  Outpatient specialists:   LOS - 2  days    Chief Complaint  Patient presents with  . Wound Infection       Brief summary   Troy Ponce is a 40 y.o. male with medical history significant for bipolar disorder, ??? 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 48 hours prior to seeking treatment in the ER he had been seen by his PCP prescribed oral Keflex and gave one dose of Rocephin but unfortunately patient's symptoms did not improve. He reported subjective fevers and chills at home. He was eventually admitted to the hospital. X-ray done during that admission did demonstrate a small knee joint effusion. He was given empiric vancomycin and Zosyn. He left AMA on 7/7 in the early morning hours after having a disagreement with one of the nurses. After arriving home he took the Keflex he was previously prescribed. Because he did not have pain medications he increased his alcohol use to treat his pain. Unfortunately pain and swelling increased and is now involving the foot as well as the thigh area. He again reported fevers and chills while at home. He presented to the ER for evaluation and treatment.   Patient started on IV vancomycin and Unasyn, vancomycin was discontinued yesterday as MRSA screen was negative. However cellulitis appears to be worsening this morning. ID consulted,  discussed with Dr. Orvan Falconerampbell.  Assessment & Plan    Principal Problem:  Cellulitis of right lower leg: Appears to be more erythematous today from knee to the foot -Patient has failed outpatient therapy with oral Keflex noted with progressive cellulitic changes and increasing pain and edema of right lower leg  -On IV vancomycin and Unasyn. MRSA screen negative, vancomycin was discontinued on 7/10. CT negative for any myositis or abscess - Currently on IV Unasyn, discussed with infectious disease, Dr. Orvan Falconerampbell for consult  Pain control/narcotic seeking behavior -Pain control with combination of PO narcotics (patient recently on short acting morphine 30 mg every 4 hours so continue) plus IV morphine -Scheduled IV Toradol with BID Pepcid - No escalation of pain medication regimen    HNP (herniated nucleus pulposus), lumbar -Has been evaluated by Dr. Mikal Planeabell as an outpatient   Alcohol use --Reports at least 2-3 beers several times per week with increased alcohol use after leaving AMA to assist with pain control -Alcohol level elevated at 38, UDS positive for opiates and benzos -  place on alcohol withdrawal protocol   Macrocytic anemia -B12, folate within normal limits, TSH  1.1   Bipolar disorder  -Interestingly, is not on appropriate medications for this disorder although he does take Adderall but does not appear to carry a formal diagnosis of ADHD   Tobacco abuse -Nicotine patch   Code Status: Full code DVT Prophylaxis:  Lovenox  Family Communication: Discussed in detail with the patient, all imaging results, lab results explained to the patient  Disposition Plan:   Time Spent in minutes   25 minutes  Procedures:  CT tibia-fibula  Consultants:   None  Antimicrobials:   IV vancomycin 7/9> 7/10  IV Unasyn 7/9   Medications  Scheduled Meds: . amphetamine-dextroamphetamine  30 mg Oral BID  . ampicillin-sulbactam (UNASYN) IV  3 g Intravenous Q8H  . docusate  sodium  100 mg Oral BID  . enoxaparin (LOVENOX) injection  40 mg Subcutaneous Q24H  . famotidine  20 mg Oral BID  . folic acid  1 mg Oral Daily  . ketorolac  15 mg Intravenous Q6H  . loratadine  10 mg Oral Daily  . multivitamin with minerals  1 tablet Oral Daily  . nicotine  21 mg Transdermal Daily  . thiamine  100 mg Oral Daily   Or  . thiamine  100 mg Intravenous Daily   Continuous Infusions:  PRN Meds:.acetaminophen **OR** acetaminophen, albuterol, LORazepam **OR** LORazepam, morphine, morphine injection, ondansetron **OR** ondansetron (ZOFRAN) IV, zolpidem   Antibiotics   Anti-infectives    Start     Dose/Rate Route Frequency Ordered Stop   09/30/15 2000  vancomycin (VANCOCIN) IVPB 1000 mg/200 mL premix  Status:  Discontinued     1,000 mg 200 mL/hr over 60 Minutes Intravenous Every 8 hours 09/30/15 1050 10/01/15 1329   09/30/15 1600  Ampicillin-Sulbactam (UNASYN) 3 g in sodium chloride 0.9 % 100 mL IVPB     3 g 100 mL/hr over 60 Minutes Intravenous Every 8 hours 09/30/15 1050     09/30/15 1100  vancomycin (VANCOCIN) 1,750 mg in sodium chloride 0.9 % 500 mL IVPB     1,750 mg 250 mL/hr over 120 Minutes Intravenous  Once 09/30/15 1034 09/30/15 1933   09/30/15 0945  vancomycin (VANCOCIN) IVPB 1000 mg/200 mL premix  Status:  Discontinued     1,000 mg 200 mL/hr over 60 Minutes Intravenous  Once 09/30/15 0931 09/30/15 1204   09/30/15 0945  piperacillin-tazobactam (ZOSYN) IVPB 3.375 g     3.375 g 12.5 mL/hr over 240 Minutes Intravenous  Once 09/30/15 0931 09/30/15 1346        Subjective:   Troy Ponce was seen and examined today. Upset over pain medications and worsening cellulitis, rough night and overnight issues noted. Patient denies dizziness, chest pain, shortness of breath, abdominal pain, N/V/D/C, new weakness, numbess, tingling. No fevers or chills.  Objective:   Filed Vitals:   10/01/15 2154 10/02/15 0027 10/02/15 0518 10/02/15 1157  BP: 148/90 145/94 119/69  151/82  Pulse: 91 95 77 77  Temp: 99.3 F (37.4 C) 98.9 F (37.2 C) 98.3 F (36.8 C) 98.6 F (37 C)  TempSrc: Oral Oral Oral Oral  Resp: 19 18 17 17   Height:      Weight:      SpO2: 99% 100% 96% 100%    Intake/Output Summary (Last 24 hours) at 10/02/15 1254 Last data filed at 10/02/15 1151  Gross per 24 hour  Intake     60 ml  Output    750 ml  Net   -690 ml     Wt Readings from Last 3  Encounters:  09/30/15 95.573 kg (210 lb 11.2 oz)  09/28/15 95.936 kg (211 lb 8 oz)  03/11/14 93.611 kg (206 lb 6 oz)     Exam  General: Alert and oriented x 3, NAD, Somewhat agitated  HEENT:  Neck:   Cardiovascular: S1 S2 auscultated, no rubs, murmurs or gallops. Regular rate and rhythm.  Respiratory: Clear to auscultation bilaterally, no wheezing, rales or rhonchi  Gastrointestinal: Soft, nontender, nondistended, + bowel sounds  Ext: no cyanosis clubbing or edema  Neuro: no new deficits  Skin: Right lower extremity cellulitic changes with edema from knee to foot, somewhat worsening and new areas of demarcation with the red  Psych: Normal affect and demeanor, alert and oriented x3    Data Reviewed:  I have personally reviewed following labs and imaging studies  Micro Results Recent Results (from the past 240 hour(s))  Culture, blood (Routine x 2)     Status: None (Preliminary result)   Collection Time: 09/27/15  4:21 PM  Result Value Ref Range Status   Specimen Description BLOOD LEFT ANTECUBITAL  Final   Special Requests BOTTLES DRAWN AEROBIC AND ANAEROBIC 5CC  Final   Culture NO GROWTH 4 DAYS  Final   Report Status PENDING  Incomplete  Culture, blood (Routine x 2)     Status: None (Preliminary result)   Collection Time: 09/27/15  5:06 PM  Result Value Ref Range Status   Specimen Description BLOOD RIGHT FOREARM  Final   Special Requests BOTTLES DRAWN AEROBIC AND ANAEROBIC 5CC  Final   Culture NO GROWTH 4 DAYS  Final   Report Status PENDING  Incomplete  Urine  culture     Status: None   Collection Time: 09/27/15  6:29 PM  Result Value Ref Range Status   Specimen Description URINE, RANDOM  Final   Special Requests NONE  Final   Culture NO GROWTH  Final   Report Status 09/28/2015 FINAL  Final  Culture, blood (Routine X 2) w Reflex to ID Panel     Status: None (Preliminary result)   Collection Time: 09/30/15 10:56 AM  Result Value Ref Range Status   Specimen Description BLOOD LEFT ANTECUBITAL  Final   Special Requests BOTTLES DRAWN AEROBIC AND ANAEROBIC  5CC  Final   Culture NO GROWTH < 24 HOURS  Final   Report Status PENDING  Incomplete  Culture, blood (Routine X 2) w Reflex to ID Panel     Status: None (Preliminary result)   Collection Time: 09/30/15 11:03 AM  Result Value Ref Range Status   Specimen Description BLOOD LEFT HAND  Final   Special Requests BOTTLES DRAWN AEROBIC AND ANAEROBIC  5CC  Final   Culture NO GROWTH < 24 HOURS  Final   Report Status PENDING  Incomplete  MRSA PCR Screening     Status: None   Collection Time: 09/30/15  5:15 PM  Result Value Ref Range Status   MRSA by PCR NEGATIVE NEGATIVE Final    Comment:        The GeneXpert MRSA Assay (FDA approved for NASAL specimens only), is one component of a comprehensive MRSA colonization surveillance program. It is not intended to diagnose MRSA infection nor to guide or monitor treatment for MRSA infections.     Radiology Reports Dg Chest 2 View  09/27/2015  CLINICAL DATA:  Cough and chest congestion over the last few days. EXAM: CHEST  2 VIEW COMPARISON:  None. FINDINGS: Heart size is normal. Mediastinal shadows are normal. The lungs are clear.  No bronchial thickening. No infiltrate, mass, effusion or collapse. Pulmonary vascularity is normal. No bony abnormality. IMPRESSION: Normal chest Electronically Signed   By: Paulina Fusi M.D.   On: 09/27/2015 16:44   Ct Tibia Fibula Right W Contrast  09/30/2015  CLINICAL DATA:  RIGHT lower extremity redness and swelling. History  of cellulitis with IV antibiotic therapy initiated and potential premature termination of antibiotic therapy EXAM: CT OF THE RIGHT TIBIA AND FIBULA WITH CONTRAST TECHNIQUE: CT of the RIGHT lower extremity. CONTRAST:  1 ISOVUE-300 IOPAMIDOL (ISOVUE-300) INJECTION 61% COMPARISON:  Plain films 09/27/2015 FINDINGS: There is haziness within the subcutaneous fat anterior to the tibia extending from the level patella to the distal tibia above the ankle mortise. There is linear streaking within the subcutaneous tissue consistent with inflammation/cellulitis. There is no organized fluid collection. The inflammation appears superficial to muscular compartment. No intramuscular abscess or abnormal enhancement. There is no evidence of bony erosion to suggest osteomyelitis. Small suprapatellar joint effusion. IMPRESSION: 1. Cellulitis along the anterior aspect of the RIGHT lower extremity. 2. No evidence of subcutaneous abscess or gas collection. 3. No evidence of muscular abscess. 4. No evidence of osteo myelitis. Electronically Signed   By: Genevive Bi M.D.   On: 09/30/2015 12:24   Dg Knee Complete 4 Views Right  09/27/2015  CLINICAL DATA:  Right knee pain and swelling for 2 weeks. No known injury. EXAM: RIGHT KNEE - COMPLETE 4+ VIEW COMPARISON:  None. FINDINGS: No evidence of fracture or dislocation. No evidence of arthropathy or other focal bone abnormality. Possible small suprapatellar joint effusion. Diffuse soft tissue edema appears most prominent at. No soft tissue air. No radiopaque foreign body. IMPRESSION: Soft tissue edema most prominent anteriorly. Small joint effusion. No bony abnormality. Electronically Signed   By: Rubye Oaks M.D.   On: 09/27/2015 18:05    Lab Data:  CBC:  Recent Labs Lab 09/27/15 1624 09/28/15 0425 09/30/15 0937 10/01/15 0624 10/02/15 0656  WBC 17.5* 13.2* 9.7 6.5 7.6  NEUTROABS 15.9*  --  6.8  --   --   HGB 13.4 11.3* 12.0* 11.0* 11.1*  HCT 41.0 33.8* 37.7* 33.7*  34.2*  MCV 98.8 99.1 101.6* 100.0 98.8  PLT 166 143* 197 174 204   Basic Metabolic Panel:  Recent Labs Lab 09/27/15 1624 09/28/15 0425 09/30/15 0937 10/01/15 0624 10/02/15 0656  NA 135 134* 140 139 139  K 4.2 3.9 3.5 3.5 3.8  CL 101 105 109 108 104  CO2 23 25 22 26 30   GLUCOSE 157* 115* 115* 122* 104*  BUN 24* 17 9 11 8   CREATININE 1.41* 0.94 0.60* 0.71 0.68  CALCIUM 9.5 8.2* 8.3* 8.3* 8.5*   GFR: Estimated Creatinine Clearance: 139.5 mL/min (by C-G formula based on Cr of 0.68). Liver Function Tests:  Recent Labs Lab 09/27/15 1624 10/01/15 0624  AST 53* 45*  ALT 52 40  ALKPHOS 79 44  BILITOT 1.2 0.4  PROT 6.9 5.2*  ALBUMIN 3.4* 2.2*   No results for input(s): LIPASE, AMYLASE in the last 168 hours. No results for input(s): AMMONIA in the last 168 hours. Coagulation Profile:  Recent Labs Lab 09/27/15 2000  INR 1.25   Cardiac Enzymes:  Recent Labs Lab 09/30/15 1106  CKTOTAL 44*   BNP (last 3 results) No results for input(s): PROBNP in the last 8760 hours. HbA1C: No results for input(s): HGBA1C in the last 72 hours. CBG:  Recent Labs Lab 10/02/15 0800  GLUCAP 103*   Lipid Profile: No results  for input(s): CHOL, HDL, LDLCALC, TRIG, CHOLHDL, LDLDIRECT in the last 72 hours. Thyroid Function Tests:  Recent Labs  09/30/15 1056  TSH 1.128   Anemia Panel:  Recent Labs  09/30/15 1107  VITAMINB12 585  FOLATE 25.6  FERRITIN 999*  TIBC 214*  IRON 35*  RETICCTPCT 2.0   Urine analysis:    Component Value Date/Time   COLORURINE ORANGE* 09/27/2015 1829   APPEARANCEUR CLEAR 09/27/2015 1829   LABSPEC 1.032* 09/27/2015 1829   PHURINE 5.5 09/27/2015 1829   GLUCOSEU NEGATIVE 09/27/2015 1829   HGBUR NEGATIVE 09/27/2015 1829   BILIRUBINUR SMALL* 09/27/2015 1829   KETONESUR 15* 09/27/2015 1829   PROTEINUR NEGATIVE 09/27/2015 1829   NITRITE NEGATIVE 09/27/2015 1829   LEUKOCYTESUR TRACE* 09/27/2015 1829     Genoa Freyre M.D. [facility] 10/02/2015, 12:54 PM  Pager: [phone] Between 7am to 7pm - call Pager - [phone]  After 7pm go to [email or web address] - password [email or web address]  Call night coverage person covering after 7pm

## 2015-10-02 NOTE — Care Management Note (Signed)
Case Management Note  Patient Details  Name: Janan RidgeJustin A Blok MRN: [id number] Date of Birth: 02/02/1976  Subjective/Objective:                 Patient admitted for cellulitis to leg. ID consulted and changed to Vanc. Pt w/ h/o substance abuse. From home alone.   Action/Plan:  CM will continue to follow for DC planning.   Expected Discharge Date:                  Expected Discharge Plan:  Home/Self Care  In-House Referral:     Discharge planning Services  CM Consult  Post Acute Care Choice:    Choice offered to:     DME Arranged:    DME Agency:     HH Arranged:    HH Agency:     Status of Service:  In process, will continue to follow  If discussed at Long Length of Stay Meetings, dates discussed:    Additional Comments:  Lawerance SabalDebbie Liv Rallis, RN 10/02/2015, 4:22 PM

## 2015-10-02 NOTE — Progress Notes (Signed)
When rounding with off going RN pt IV tubing was turned off and disconnected d/t IV abx being completed. RN observes pt on video surveillance messing with IV pump. He turns it on and turns it around and then hooks up the tubing back up to his IV. RN then returns to pt room to find that the pt pressing buttons on the IV pump. RN asks the pt what is he doing and his response is "I don't know what is wrong with this, it is not wokring". RN tells pt that it was disconnected d/t fluids being completed, that he does not need to be connected and that he cannot connect himself to the IV pump only the RNs can and if he has questions about it to ask. Will continue to monitor pt.

## 2015-10-03 NOTE — Progress Notes (Signed)
Noticed on surveillance camera at nurses station that view into room became cloudy and patient was unable to be seen. This RN and CN Nelda MarseilleJenny Thacker went to patient's room to check on patient. The patient taped ice pitcher over camera. Explained to patient that he was in camera room due to safety issues. Patient became hostile towards staff and stated his HIPPA rights were being violated by being on camera. Patient had been moved to camera room due to smoke smell in patient's previous room. Explained reason to patient. Patient stated he had been taking pictures and video recording staff providing care and rounding on patient. Per patient, he was in the process of suing staff/hospital for care received and changing of medications. Patient was writing names of staff members down to provide to attorney. Patient was adamant that ice pitcher will not come down. CN notified AC Shelly. Security and Burnett HarryShelly arrived to unit and discussed situation with patient but patient continued to be hostile and spoke over staff. [facility]. Patient attempted to leave AMA with peripheral IV in place. AC and security followed patient off unit to elevators where patient removed IV himself. Patient refused to sign AMA form and [facility]. Patient continued to complain of pain throughout discussion with staff members. Once patient left, room was checked by staff and a bottle of pills were found in patient's bedside drawer. Medication bottle contained doxycycline capsules and two strengths of amphetamine-dextroamphetamine. Medication was sent to pharmacy by CN. Smita Glosson, Interior and spatial designerdirector of unit, was notified of situation with patient.

## 2015-10-03 NOTE — Progress Notes (Signed)
Late entry: I was called by the 5W CN to come to the floor to help with a problem. I arrived and the CN and RN were exiting the patients room and we walked to speak of the ongoing issue. They explained to me that the patient had taped a water pitcher over the camera in the room because his HIPPA rights were being violated. The patient was moved to the camera room because the room he was in smelled of cigarette smoke, and concerns of smoking in the room. The patient was hostile toward the CN and RN and would not listen to any explanations. I and the security officer went in to the patients room to speak with him, He immediately saw the officer and said I came into the room to intimidate him with the Animal nutritionist. I tried introducing myself to him and he said I was trying to be a Dr. Every time I would try to say something to Troy Ponce he would speak very loudly over me and would not listen to me, He was very angry, and when asked why he was so angry, he stated that all the nurses that he has had were either "Black or from another country" I asked him about his current nurse (who was White) and he changed the subject. He stated multiple times that he was suing the hospital and nurses and Drs for ?Marland Kitchen He finally said he was leaving and going to [facility], I told him that was his choice. He told me to take his IV out and I said that his nurse would be back in a minute to do so. Myself and the officer left the room, In a few minutes He came out of the room and tried to leave with the IV in his arm, Myself and the officer stated he could not leave with the IV, he said he wasn't letting us take it out. We met him at the elevator and the patient ripped the IV out himself and got on the elevator. He would not and did not sign the AMA paper.

## 2015-10-05 LAB — CULTURE, BLOOD (ROUTINE X 2)
CULTURE: NO GROWTH
Culture: NO GROWTH

## 2015-10-10 NOTE — Discharge Summary (Signed)
AMA NOTE    Patient ID: Troy Ponce MRN: [id number] DOB/AGE: March 07, 1976 40 y.o.  Admit date: 09/30/2015 Discharge date: 10/02/15  PLEASE NOTE THAT PATIENT LEFT AGAINST MEDICAL ADVICE. Risks of Worsening cellulitis, infection, sepsis were explained in detail to the patient and he verbally understood the risks of leaving AMA.   Primary Care Physician:  Egbert Garibaldi, NP  Discharge Diagnoses:    . Alcohol use . Bipolar disorder (HCC) . Cellulitis of right lower leg . HNP (herniated nucleus pulposus), lumbar . Tobacco abuse . Macrocytic anemia  Consults: Infectious disease, Dr. Orvan Falconer   Recommendations for Outpatient Follow-up:  Patient left AMA     Allergies:   Allergies  Allergen Reactions  . Other Anaphylaxis    Unknown B/P med (refer to: Marva Panda at [facility]- [address] [phone]) 10/02/15: [place] to try to clarify allergy. They did not have record of any BP medications. K. Millsaps not working so unable to speak to her.  Cy Blamer [Brassica Oleracea Italica] Other (See Comments)    "stomach feels like razor blades" if eaten      Discharge Medications: Please note that patient left AMA (against medical advice)   Brief H and P: For complete details please refer to admission H and P, but in briefJustin A Ponce is a 40 y.o. male with medical history significant for bipolar disorder, ??? 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 48 hours prior to seeking treatment in the ER he had been seen by his PCP prescribed oral Keflex and gave one dose of Rocephin but unfortunately patient's symptoms did not improve. He reported  subjective fevers and chills at home. He was eventually admitted to the hospital. X-ray done during that admission did demonstrate a small knee joint effusion. He was given empiric vancomycin and Zosyn. He left AMA on 7/7 in the early morning hours after having a disagreement with one of the nurses. After arriving home he took the Keflex he was previously prescribed. Because he did not have pain medications he increased his alcohol use to treat his pain. Unfortunately pain and swelling increased and is now involving the foot as well as the thigh area. He again reported fevers and chills while at home. He presented to the ER for evaluation and treatment.   Hospital Course:  Cellulitis of right lower leg: Appears to be more erythematous today from knee to the foot -Patient has failed outpatient therapy with oral Keflex noted with progressive cellulitic changes and increasing pain and edema of right lower leg. He was placed on IV vancomycin and Unasyn. CT was negative for any myositis or abscess. The antibiotics were narrowed to IV Unasyn only. He did not have significant improvement. Hence infectious disease, Dr. Orvan Falconer was consulted and recommended continuing the IV vancomycin. Patient apparently however left AMA in the middle of the night, patient had become very belligerent and hostile toward the staff. Please see the detailed note from RN, Ms Dorise Hiss. He tripped his IV line out himself and got on the elevator and refused to sign the AMA form.   Pain control/narcotic seeking behavior -Pain control with combination of PO narcotics (patient recently on short acting morphine 30 mg every 4  hours so continue) plus IV morphine. During the hospitalization patient continued to demand more IV pain medications clearly demonstrating narcotic seeking behavior    HNP (herniated nucleus pulposus), lumbar -Has been evaluated by Dr. Mikal Planeabell as an outpatient   Alcohol use --Reports at least 2-3 beers several  times per week with increased alcohol use after leaving AMA to assist with pain control -Alcohol level elevated at 38, UDS positive for opiates and benzos - Patient was placed on alcohol withdrawal protocol   Macrocytic anemia -B12, folate within normal limits, TSH 1.1   Bipolar disorder  -Interestingly, is not on appropriate medications for this disorder although he does take Adderall but does not appear to carry a formal diagnosis of ADHD   Tobacco abuse -Nicotine patch   Day of Discharge BP 126/72 mmHg  Pulse 88  Temp(Src) 98.6 F (37 C) (Oral)  Resp 18  Ht 5\' 10"  (1.778 m)  Wt 95.573 kg (210 lb 11.2 oz)  BMI 30.23 kg/m2  SpO2 100%   The results of significant diagnostics from this hospitalization (including imaging, microbiology, ancillary and laboratory) are listed below for reference.    LAB RESULTS: Basic Metabolic Panel: No results for input(s): NA, K, CL, CO2, GLUCOSE, BUN, CREATININE, CALCIUM, MG, PHOS in the last 168 hours. Liver Function Tests: No results for input(s): AST, ALT, ALKPHOS, BILITOT, PROT, ALBUMIN in the last 168 hours. No results for input(s): LIPASE, AMYLASE in the last 168 hours. No results for input(s): AMMONIA in the last 168 hours. CBC: No results for input(s): WBC, NEUTROABS, HGB, HCT, MCV, PLT in the last 168 hours. Cardiac Enzymes: No results for input(s): CKTOTAL, CKMB, CKMBINDEX, TROPONINI in the last 168 hours. BNP: Invalid input(s): POCBNP CBG: No results for input(s): GLUCAP in the last 168 hours.  Significant Diagnostic Studies:  Ct Tibia Fibula Right W Contrast  09/30/2015  CLINICAL DATA:  RIGHT lower extremity redness and swelling. History of cellulitis with IV antibiotic therapy initiated and potential premature termination of antibiotic therapy EXAM: CT OF THE RIGHT TIBIA AND FIBULA WITH CONTRAST TECHNIQUE: CT of the RIGHT lower extremity. CONTRAST:  1 ISOVUE-300 IOPAMIDOL (ISOVUE-300) INJECTION 61% COMPARISON:  Plain films  09/27/2015 FINDINGS: There is haziness within the subcutaneous fat anterior to the tibia extending from the level patella to the distal tibia above the ankle mortise. There is linear streaking within the subcutaneous tissue consistent with inflammation/cellulitis. There is no organized fluid collection. The inflammation appears superficial to muscular compartment. No intramuscular abscess or abnormal enhancement. There is no evidence of bony erosion to suggest osteomyelitis. Small suprapatellar joint effusion. IMPRESSION: 1. Cellulitis along the anterior aspect of the RIGHT lower extremity. 2. No evidence of subcutaneous abscess or gas collection. 3. No evidence of muscular abscess. 4. No evidence of osteo myelitis. Electronically Signed   By: Genevive BiStewart  Edmunds M.D.   On: 09/30/2015 12:24    2D ECHO:   Disposition and Follow-up:    DISPOSITION: Patient left AMA. He was advised to seek follow-up with primary care physician.     DISCHARGE FOLLOW-UP  none, patient left AMA    Signed:   Rowdy Guerrini M.D. [facility] 10/10/2015, 6:43 AM Pager: [phone]

## 2016-05-04 ENCOUNTER — Emergency Department (HOSPITAL_COMMUNITY)
Admission: EM | Admit: 2016-05-04 | Discharge: 2016-05-04 | Disposition: A | Discharge status: Home / Self Care | Payer: BLUE CROSS/BLUE SHIELD | Attending: Emergency Medicine | Admitting: Emergency Medicine

## 2016-05-04 ENCOUNTER — Encounter (HOSPITAL_COMMUNITY): Payer: Self-pay | Admitting: Emergency Medicine

## 2016-05-04 DIAGNOSIS — F17210 Nicotine dependence, cigarettes, uncomplicated: Secondary | ICD-10-CM | POA: Diagnosis not present

## 2016-05-04 DIAGNOSIS — M545 Low back pain: Secondary | ICD-10-CM | POA: Diagnosis present

## 2016-05-04 DIAGNOSIS — G8929 Other chronic pain: Secondary | ICD-10-CM | POA: Diagnosis not present

## 2016-05-04 DIAGNOSIS — Z79899 Other long term (current) drug therapy: Secondary | ICD-10-CM | POA: Insufficient documentation

## 2016-05-04 MED ORDER — KETOROLAC TROMETHAMINE 60 MG/2ML IM SOLN
60.0000 mg | Freq: Once | INTRAMUSCULAR | Status: AC
  Administered 2016-05-04: 60 mg via INTRAMUSCULAR
  Filled 2016-05-04: qty 2

## 2016-05-04 MED ORDER — IBUPROFEN 600 MG PO TABS: 600.0000 mg | ORAL_TABLET | Freq: Four times a day (QID) | ORAL | 0 refills | Status: DC | PRN

## 2016-05-04 MED ORDER — METHOCARBAMOL 500 MG PO TABS: 1000.0000 mg | ORAL_TABLET | Freq: Four times a day (QID) | ORAL | 0 refills | Status: DC | PRN

## 2016-05-04 MED ORDER — METHOCARBAMOL 500 MG PO TABS
1000.0000 mg | ORAL_TABLET | Freq: Once | ORAL | Status: AC
  Administered 2016-05-04: 1000 mg via ORAL
  Filled 2016-05-04: qty 2

## 2016-05-04 NOTE — ED Provider Notes (Signed)
[facility]-EMERGENCY DEPT Provider Note   CSN: [id number] Arrival date & time: 05/04/16  0759     History   Chief Complaint Chief Complaint  Patient presents with  . Back Pain    HPI Troy Ponce is a 41 y.o. male.  HPI Patient has chronic low back pain from ruptured disc. States he has an appointment to see a Midwifeneurosurgeon. Denies any new trauma. States that for the last week. The pain has been worsening. Denies any radiation of the pain to the legs. Denies any urinary or bowel incontinence. No fever or chills. No weakness or numbness. Patient was taking muscle relaxer but ran out. Past Medical History:  Diagnosis Date  . Allergy   . Bipolar disorder (HCC)   . Pneumonia   . Tobacco abuse     Patient Active Problem List   Diagnosis Date Noted  . Macrocytic anemia 09/30/2015  . HNP (herniated nucleus pulposus), lumbar 09/27/2015  . Sepsis (HCC) 09/27/2015  . Alcohol use 09/27/2015  . Cellulitis of right lower leg 09/27/2015  . AKI (acute kidney injury) (HCC) 09/27/2015  . Tobacco abuse   . Bipolar disorder (HCC)   . Alcohol intoxication (HCC) 08/23/2012    Past Surgical History:  Procedure Laterality Date  . APPENDECTOMY    . HERNIA REPAIR    . SHOULDER ARTHROSCOPY         Home Medications    Prior to Admission medications   Medication Sig Start Date End Date Taking? Authorizing Provider  amphetamine-dextroamphetamine (ADDERALL) 30 MG tablet Take 15-30 mg by mouth 2 (two) times daily.    Yes Historical Provider, MD  busPIRone (BUSPAR) 10 MG tablet Take 20 mg by mouth 3 (three) times daily. 03/13/16  Yes Historical Provider, MD  lamoTRIgine (LAMICTAL) 200 MG tablet Take 300 mg by mouth daily.   Yes Historical Provider, MD  traZODone (DESYREL) 50 MG tablet Take 50-100 mg by mouth at bedtime.   Yes Historical Provider, MD  albuterol (PROVENTIL HFA;VENTOLIN HFA) 108 (90 BASE) MCG/ACT inhaler Inhale 2 puffs into the lungs every 4 (four) hours as needed for wheezing  or shortness of breath (cough, shortness of breath or wheezing.). Patient taking differently: Inhale 2 puffs into the lungs every 4 (four) hours as needed for wheezing or shortness of breath.  03/11/14   [name], MD  cetirizine (ZYRTEC) 10 MG tablet Take 10 mg by mouth daily as needed for allergies. Reported on 09/30/2015    Historical Provider, MD  ibuprofen (ADVIL,MOTRIN) 600 MG tablet Take 1 tablet (600 mg total) by mouth every 6 (six) hours as needed. 05/04/16   [name] [name], MD  methocarbamol (ROBAXIN) 500 MG tablet Take 2 tablets (1,000 mg total) by mouth every 6 (six) hours as needed for muscle spasms. 05/04/16   [name] [name], MD  ranitidine (ZANTAC) 75 MG tablet Take 75 mg by mouth daily as needed for heartburn.    Historical Provider, MD  sildenafil (VIAGRA) 100 MG tablet Take 25-100 mg by mouth daily as needed for erectile dysfunction.     Historical Provider, MD    Family History Family History  Problem Relation Age of Onset  . Hypertension Mother   . Hypertension Father   . Cancer Maternal Grandmother   . Heart disease Maternal Grandfather   . Cancer Paternal Grandmother   . Diabetes Paternal Grandmother   . Cancer Paternal Grandfather     Social History Social History  Substance Use Topics  . Smoking status: Current Every Day  Smoker    Packs/day: 2.00    Years: 30.00    Types: Cigarettes    Start date: 03/11/1984  . Smokeless tobacco: Never Used  . Alcohol use 3.6 oz/week    6 Cans of beer per week     Comment: former     Allergies   Other; Lisinopril; and Broccoli [brassica oleracea italica]   Review of Systems Review of Systems  Constitutional: Negative for chills and fever.  Respiratory: Negative for cough and shortness of breath.   Cardiovascular: Negative for chest pain.  Gastrointestinal: Positive for constipation. Negative for abdominal pain, diarrhea, nausea and vomiting.  Genitourinary: Negative for difficulty urinating, dysuria, flank  pain, frequency and hematuria.  Musculoskeletal: Positive for back pain and myalgias. Negative for neck pain and neck stiffness.  Skin: Negative for rash and wound.  Neurological: Negative for dizziness, weakness, light-headedness, numbness and headaches.  All other systems reviewed and are negative.    Physical Exam Updated Vital Signs BP 124/78   Pulse 90   Temp 98.9 F (37.2 C) (Oral)   Resp 22   Ht 5\' 10"  (1.778 m)   Wt 210 lb (95.3 kg)   SpO2 92%   BMI 30.13 kg/m   Physical Exam  Constitutional: He is oriented to person, place, and time. He appears well-developed and well-nourished.  Patient is sobbing in room  HENT:  Head: Normocephalic and atraumatic.  Mouth/Throat: Oropharynx is clear and moist.  Eyes: EOM are normal. Pupils are equal, round, and reactive to light.  Neck: Normal range of motion. Neck supple.  Cardiovascular: Normal rate and regular rhythm.   Pulmonary/Chest: Effort normal and breath sounds normal.  Abdominal: Soft. Bowel sounds are normal. There is no tenderness. There is no rebound and no guarding.  Musculoskeletal: Normal range of motion. He exhibits tenderness. He exhibits no edema.  Patient has midline lower lumbar tenderness to palpation. There is muscle spasm and tenderness to the right lumbar paraspinal musculature. No step-offs or deformities. Negative straight leg raise. No lower extremity swelling, asymmetry or tenderness. 2+ distal pulses in all extremities. No CVA tenderness bilaterally.  Neurological: He is alert and oriented to person, place, and time.  5/5 motor in all extremities. Sensation is fully intact. No saddle anesthesia.  Skin: Skin is warm and dry. Capillary refill takes less than 2 seconds. No rash noted. No erythema.  Psychiatric:  Tearful and anxious appearing  Nursing note and vitals reviewed.    ED Treatments / Results  Labs (all labs ordered are listed, but only abnormal results are displayed) Labs Reviewed - No  data to display  EKG  EKG Interpretation None       Radiology No results found.  Procedures Procedures (including critical care time)  Medications Ordered in ED Medications  ketorolac (TORADOL) injection 60 mg (60 mg Intramuscular Given 05/04/16 1001)  methocarbamol (ROBAXIN) tablet 1,000 mg (1,000 mg Oral Given 05/04/16 1002)     Initial Impression / Assessment and Plan / ED Course  I have reviewed the triage vital signs and the nursing notes.  Pertinent labs & imaging results that were available during my care of the patient were reviewed by me and considered in my medical decision making (see chart for details).     No red flag signs or symptoms. Patient's elevated heart rate likely related to pain and anxiety. Low suspicion for infectious process. Will treat and reevaluate.  Tachycardia has resolved. Patient is resting comfortably. Has appointment to follow-up with neurosurgery. We will  discharge home with anti-inflammatory and muscle relaxants. Return precautions given. Final Clinical Impressions(s) / ED Diagnoses   Final diagnoses:  Chronic right-sided low back pain without sciatica    New Prescriptions New Prescriptions   IBUPROFEN (ADVIL,MOTRIN) 600 MG TABLET    Take 1 tablet (600 mg total) by mouth every 6 (six) hours as needed.   METHOCARBAMOL (ROBAXIN) 500 MG TABLET    Take 2 tablets (1,000 mg total) by mouth every 6 (six) hours as needed for muscle spasms.     Loren Racer, MD 05/04/16 1126

## 2016-05-04 NOTE — ED Triage Notes (Signed)
Pt reports that he has back pain for a while, pt is scheduled to see a neurosurgeon end of this month but unable to tolerate the pain. Pt was diagnosed with ruptured disk in the past but had delay in seeking treatment due to personal issues. PT has been receiving flexeril and robaxin from his PMD which he is currently out of. Pt gets some relief with leaning forward.

## 2016-05-14 ENCOUNTER — Ambulatory Visit (INDEPENDENT_AMBULATORY_CARE_PROVIDER_SITE_OTHER): Payer: Self-pay | Admitting: Orthopaedic Surgery

## 2016-05-27 ENCOUNTER — Ambulatory Visit (INDEPENDENT_AMBULATORY_CARE_PROVIDER_SITE_OTHER): Payer: Self-pay | Admitting: Orthopaedic Surgery

## 2016-06-11 ENCOUNTER — Encounter (INDEPENDENT_AMBULATORY_CARE_PROVIDER_SITE_OTHER): Payer: Self-pay | Admitting: Orthopaedic Surgery

## 2016-06-11 ENCOUNTER — Ambulatory Visit (INDEPENDENT_AMBULATORY_CARE_PROVIDER_SITE_OTHER): Payer: BLUE CROSS/BLUE SHIELD

## 2016-06-11 ENCOUNTER — Other Ambulatory Visit (INDEPENDENT_AMBULATORY_CARE_PROVIDER_SITE_OTHER): Payer: Self-pay

## 2016-06-11 ENCOUNTER — Ambulatory Visit (INDEPENDENT_AMBULATORY_CARE_PROVIDER_SITE_OTHER): Payer: BLUE CROSS/BLUE SHIELD | Admitting: Physician Assistant

## 2016-06-11 DIAGNOSIS — M545 Low back pain, unspecified: Secondary | ICD-10-CM

## 2016-06-11 DIAGNOSIS — G8929 Other chronic pain: Secondary | ICD-10-CM

## 2016-06-11 MED ORDER — CYCLOBENZAPRINE HCL 10 MG PO TABS: 10.0000 mg | ORAL_TABLET | Freq: Three times a day (TID) | ORAL | 0 refills | Status: DC | PRN

## 2016-06-11 MED ORDER — HYDROCODONE-ACETAMINOPHEN 5-325 MG PO TABS: 1.0000 | ORAL_TABLET | ORAL | 0 refills | Status: DC | PRN

## 2016-06-11 NOTE — Progress Notes (Signed)
Office Visit Note   Patient: Troy Ponce           Date of Birth: Dec 17, 1975           MRN: [id number] Visit Date: 06/11/2016              Requested by: Marva PandaKimberly Millsaps, NP [facility] [address] [place], [place] [id number] PCP: Egbert GaribaldiMillsaps, KIMBERLY M, NP   Assessment & Plan: Visit Diagnoses:  1. Chronic bilateral low back pain without sciatica     Plan: MRI of lumber spine to evaluate L3-L4 disc herniation and evaluate for progression of disc bulge L4-L5 and L5-S1. Follow with Dr. Ophelia CharterYates after the MRI to go over results and discuss further treatment. Prescription is given for Flexeril and for hydrocodone he is to use these sparingly.  Follow-Up Instructions: Return in about 2 weeks (around 06/25/2016) for after MRI with Dr. Ophelia CharterYates.   Orders:  Orders Placed This Encounter  Procedures  . XR Lumbar Spine 2-3 Views   Meds ordered this encounter  Medications  . cyclobenzaprine (FLEXERIL) 10 MG tablet    Sig: Take 1 tablet (10 mg total) by mouth 3 (three) times daily as needed for muscle spasms.    Dispense:  40 tablet    Refill:  0  . HYDROcodone-acetaminophen (NORCO/VICODIN) 5-325 MG tablet    Sig: Take 1-2 tablets by mouth every 4 (four) hours as needed for moderate pain.    Dispense:  40 tablet    Refill:  0      Procedures: No procedures performed   Clinical Data: No additional findings.   Subjective: No chief complaint on file.   HPI 41 year old male with low back pain. He reports that he had a  "slip disc" last summer and was scheduled for surgery. He reports that he developed cellulitis of his right thigh and was hospitalized for this and had to cancel his surgery. He's tried to reschedule with the surgical but  he states that they would not see him again since he had canceled surgery. He is trying to establish areolar fascia with a new surgeon for his back. He states that he is having severe back pain that's awaken him at night. He does  have some radicular symptoms down to the right knee no radicular symptoms on the left. Taking some Flexeril for the pain and spasm also occasional hydrocodone. Reports that he's been to the ER and has received Toradol injections for the back pain. Pain at this point is constant lower lumbar pain. Denies any bowel or bladder dysfunction. Taken Advil and diclofenac for the pain also. Ports that the cellulitis is totally cleared up right leg. He denies any fevers chills.  MRI June 2017 showed L3-4 level right foraminal to extra foraminal disc herniation likely to compress the right L3 nerve root. And disc degeneration at L5-S1 with some potential to affect the left S1 nerve root  .Review of Systems Denies fevers, chills,shortness breath, chest pain, rashes ,or skin lesions.  Objective: Vital Signs: There were no vitals taken for this visit.  Physical Exam  Constitutional: He is oriented to person, place, and time. He appears well-developed and well-nourished. No distress.  Eyes: EOM are normal.  Cardiovascular: Intact distal pulses.   Pulmonary/Chest: Effort normal.  Neurological: He is alert and oriented to person, place, and time.  Psychiatric: Thought content normal.    Back Exam   Tenderness  The patient is experiencing tenderness in the lumbar.  Muscle  Strength  Right Quadriceps:  5/5  Left Quadriceps:  5/5  Right Hamstrings:  5/5  Left Hamstrings:  5/5   Tests  Straight leg raise right: positive Straight leg raise left: negative  Reflexes  Patellar: normal Achilles: normal  Other  Gait: normal   Comments:  He lacks about 8 inches been able to touch his toes for flexion he has very limited extension of lumbar spine. Able to walk on his heels but this stasis exacerbates the pain in his lower lumbar spine.Able to walk on his tiptoes. Exquisitely tight hamstrings bilaterally.      Specialty Comments:  No specialty comments available.  Imaging: No results  found.   PMFS History: Patient Active Problem List   Diagnosis Date Noted  . Macrocytic anemia 09/30/2015  . HNP (herniated nucleus pulposus), lumbar 09/27/2015  . Sepsis (HCC) 09/27/2015  . Alcohol use 09/27/2015  . Cellulitis of right lower leg 09/27/2015  . AKI (acute kidney injury) (HCC) 09/27/2015  . Tobacco abuse   . Bipolar disorder (HCC)   . Alcohol intoxication (HCC) 08/23/2012   Past Medical History:  Diagnosis Date  . Allergy   . Bipolar disorder (HCC)   . Pneumonia   . Tobacco abuse     Family History  Problem Relation Age of Onset  . Hypertension Mother   . Hypertension Father   . Cancer Maternal Grandmother   . Heart disease Maternal Grandfather   . Cancer Paternal Grandmother   . Diabetes Paternal Grandmother   . Cancer Paternal Grandfather     Past Surgical History:  Procedure Laterality Date  . APPENDECTOMY    . HERNIA REPAIR    . SHOULDER ARTHROSCOPY     Social History   Occupational History  . Not on file.   Social History Main Topics  . Smoking status: Current Every Day Smoker    Packs/day: 2.00    Years: 30.00    Types: Cigarettes    Start date: 03/11/1984  . Smokeless tobacco: Never Used  . Alcohol use 3.6 oz/week    6 Cans of beer per week     Comment: former  . Drug use: No     Comment: adderall  . Sexual activity: Not on file

## 2016-06-22 ENCOUNTER — Ambulatory Visit
Admission: RE | Admit: 2016-06-22 | Discharge: 2016-06-22 | Disposition: A | Discharge status: Home / Self Care | Payer: BLUE CROSS/BLUE SHIELD | Source: Ambulatory Visit | Attending: Physician Assistant | Admitting: Physician Assistant

## 2016-06-22 DIAGNOSIS — M545 Low back pain: Principal | ICD-10-CM

## 2016-06-22 DIAGNOSIS — G8929 Other chronic pain: Secondary | ICD-10-CM

## 2016-07-02 ENCOUNTER — Encounter (INDEPENDENT_AMBULATORY_CARE_PROVIDER_SITE_OTHER): Payer: Self-pay | Admitting: Orthopaedic Surgery

## 2016-07-02 ENCOUNTER — Ambulatory Visit (INDEPENDENT_AMBULATORY_CARE_PROVIDER_SITE_OTHER): Payer: BLUE CROSS/BLUE SHIELD | Admitting: Orthopaedic Surgery

## 2016-07-02 DIAGNOSIS — M5126 Other intervertebral disc displacement, lumbar region: Secondary | ICD-10-CM

## 2016-07-02 MED ORDER — HYDROCODONE-ACETAMINOPHEN 5-325 MG PO TABS: 1.0000 | ORAL_TABLET | Freq: Three times a day (TID) | ORAL | 0 refills | Status: DC | PRN

## 2016-07-02 NOTE — Progress Notes (Signed)
The patient is following up after MRI of his lumbar spine. He is someone that had a symptomatically herniated disc of the right at L3 1 year ago was cannot have surgery by someone else in town that surgery had to be acutely canceled and cellulitis in his leg. He ended up at [facility] to treat that. That since resolved but that also did not see him back since he has to cancel surgery urgently. He came in our office after being referred from emergency room still having severe right-sided radicular symptoms and was sent him for repeat MRI and is here for review this today. He still having the same severe radicular symptoms going on his right side.  On exam he has a heart problems standing straight up. He has pain with flexion-extension lumbar spine and grossly positive straight leg raise on the right and decreased sensation in his right leg. MRI does confirm a large herniated disc at the right at L3 as well as some central bulge at L4-L5. The main disease though seems to be at the right-sided L3.  I gave her copies MRI report and I did refill his hydrocodone. We will set him up to see Dr. Ophelia Charter soon to see if he is a candidate for a surgical intervention of his lumbar spine. Questions were encouraged and answered.

## 2016-07-23 ENCOUNTER — Encounter (INDEPENDENT_AMBULATORY_CARE_PROVIDER_SITE_OTHER): Payer: Self-pay | Admitting: Orthopaedic Surgery

## 2016-07-23 ENCOUNTER — Ambulatory Visit (INDEPENDENT_AMBULATORY_CARE_PROVIDER_SITE_OTHER): Payer: BLUE CROSS/BLUE SHIELD | Admitting: Orthopaedic Surgery

## 2016-07-23 DIAGNOSIS — M5126 Other intervertebral disc displacement, lumbar region: Secondary | ICD-10-CM | POA: Diagnosis not present

## 2016-07-23 MED ORDER — HYDROCODONE-ACETAMINOPHEN 5-325 MG PO TABS: 1.0000 | ORAL_TABLET | Freq: Four times a day (QID) | ORAL | 0 refills | Status: DC | PRN

## 2016-07-27 NOTE — Progress Notes (Signed)
Office Visit Note   Patient: Troy RidgeJustin A Ponce           Date of Birth: 11-05-1975           MRN: [id number] Visit Date: 07/23/2016              Requested by: Marva PandaMillsaps, Kimberly, NP [facility] [address] [place], [place] [id number] PCP: Marva PandaMillsaps, Kimberly, NP   Assessment & Plan: Visit Diagnoses:  1. HNP (herniated nucleus pulposus), lumbar     Plan: We'll set him up for a single epidural steroid injection. Patient has a moderately large foraminal disc protrusion similar to the previous MRI scan. He does not have central canal stenosis and does not have significant foraminal stenosis. Lateral recess shows mild narrowing. Mild facet degenerative changes. L5-S1 in comparison to the L4-5 level as described above shows slight left-sided disc protrusion which is unchanged and this is on the opposite side of his symptoms. We'll set him up for single epidural this is not effective then we'll proceed with operative intervention. Norco renewed.  Follow-Up Instructions: No Follow-up on file.   Orders:  Orders Placed This Encounter  Procedures  . Ambulatory referral to Physical Medicine Rehab   Meds ordered this encounter  Medications  . HYDROcodone-acetaminophen (NORCO/VICODIN) 5-325 MG tablet    Sig: Take 1 tablet by mouth every 6 (six) hours as needed for moderate pain.    Dispense:  30 tablet    Refill:  0      Procedures: No procedures performed   Clinical Data: No additional findings.   Subjective: No chief complaint on file.   HPI 41 year old male referred to me by Dr. Allie Bossierhris Blackman for symptomatic lumbar HNP at L3-4 on the right with radiculopathy. He was scheduled for surgery at [facility] but had to have it canceled due to acute cellulitis of his leg that had to be debrided. He states he has pain in all positions. He's been using hydrocodone for the pain denies bowel or bladder symptoms. No fever or chills. Patient states that he cannot  tolerate the pain much longer. When he goes from sitting standing or pins over and reaches for his feet he has bilateral lower extremity pain. Increased pain if he tries to manually lift objects.  Review of Systems positive for history of cellulitis. Negative for chest pain shortness of breath abdominal pain. Positive for numbness in his leg worse than the right than left. Negative history for seizures. Positive for bipolar disorder history of alcohol use acute kidney injury and macrocytic anemia in the past.  Objective: Vital Signs: There were no vitals taken for this visit.  Physical Exam  Constitutional: He is oriented to person, place, and time. He appears well-developed and well-nourished.  HENT:  Head: Normocephalic and atraumatic.  Eyes: EOM are normal. Pupils are equal, round, and reactive to light.  Neck: No tracheal deviation present. No thyromegaly present.  Cardiovascular: Normal rate.   Pulmonary/Chest: Effort normal. He has no wheezes.  Abdominal: Soft. Bowel sounds are normal.  Musculoskeletal:  Patient slowed getting from sitting to standing. Once he is upright he is able to ambulate heel-to-toe gait. He has sciatic notch tenderness on the right negative on the left pelvis is level.  Neurological: He is alert and oriented to person, place, and time.  Skin: Skin is warm and dry. Capillary refill takes less than 2 seconds.  Psychiatric: He has a normal mood and affect. His behavior is normal. Judgment and  thought content normal.    Ortho Exam quads hamstrings hip flexors extensors ankle dorsiflexion plantar flexion is normal the testing. Knee and ankle jerks intact. Negative straight leg raising on the left positive on the right at 80. Pain with forward flexion fingertips to mid tibial level on the right leg.  Specialty Comments:  No specialty comments available.  Imaging: Study Result   CLINICAL DATA:  Chronic bilateral low back pain without sciatica  EXAM: MRI  LUMBAR SPINE WITHOUT CONTRAST  TECHNIQUE: Multiplanar, multisequence MR imaging of the lumbar spine was performed. No intravenous contrast was administered.  COMPARISON:  Lumbar MRI 09/02/2015  FINDINGS: Segmentation:  Normal  Alignment:  Normal  Vertebrae:  Negative for fracture or mass.  Normal bone marrow  Conus medullaris: Extends to the L2 level and appears normal.  Paraspinal and other soft tissues: Paraspinous muscles normal. No retroperitoneal lesion.  Disc levels:  L1-2:  Negative  L2-3:  Mild disc bulging without significant stenosis.  L3-4: Moderately large right foraminal disc protrusion similar to the prior study. Upgoing disc material with impingement of the right L3 nerve root similar to the prior study. No significant central canal stenosis. Left foramen patent.  L4-5: Central disc protrusion has progressed in the interval. This is flattening the thecal sac and causing mild subarticular stenosis bilaterally. Neural foramina adequately patent. Mild facet degeneration without significant central canal stenosis.  L5-S1: Central and slightly left-sided disc protrusion and spurring unchanged. This is touching the left S1 nerve root but is not compressing the nerve root.  IMPRESSION: Moderate right foraminal disc protrusion L3-4 unchanged with probable impingement of the right L3 nerve root.  Central disc protrusion L4-5 has progressed and is causing mild subarticular stenosis bilaterally  Central and slightly left-sided disc protrusion L5-S1 unchanged.   Electronically Signed   By: Marlan Palau M.D.   On: 06/22/2016 08:55       PMFS History: Patient Active Problem List   Diagnosis Date Noted  . Macrocytic anemia 09/30/2015  . HNP (herniated nucleus pulposus), lumbar 09/27/2015  . Sepsis (HCC) 09/27/2015  . Alcohol use 09/27/2015  . Cellulitis of right lower leg 09/27/2015  . AKI (acute kidney injury) (HCC) 09/27/2015    . Tobacco abuse   . Bipolar disorder (HCC)   . Alcohol intoxication (HCC) 08/23/2012   Past Medical History:  Diagnosis Date  . Allergy   . Bipolar disorder (HCC)   . Pneumonia   . Tobacco abuse     Family History  Problem Relation Age of Onset  . Hypertension Mother   . Hypertension Father   . Cancer Maternal Grandmother   . Heart disease Maternal Grandfather   . Cancer Paternal Grandmother   . Diabetes Paternal Grandmother   . Cancer Paternal Grandfather     Past Surgical History:  Procedure Laterality Date  . APPENDECTOMY    . HERNIA REPAIR    . SHOULDER ARTHROSCOPY     Social History   Occupational History  . Not on file.   Social History Main Topics  . Smoking status: Current Every Day Smoker    Packs/day: 2.00    Years: 30.00    Types: Cigarettes    Start date: 03/11/1984  . Smokeless tobacco: Never Used  . Alcohol use 3.6 oz/week    6 Cans of beer per week     Comment: former  . Drug use: No     Comment: adderall  . Sexual activity: Not on file

## 2016-08-04 ENCOUNTER — Ambulatory Visit (INDEPENDENT_AMBULATORY_CARE_PROVIDER_SITE_OTHER): Payer: BLUE CROSS/BLUE SHIELD | Admitting: Physical Medicine and Rehabilitation

## 2016-08-04 ENCOUNTER — Encounter (INDEPENDENT_AMBULATORY_CARE_PROVIDER_SITE_OTHER): Payer: Self-pay | Admitting: Physical Medicine and Rehabilitation

## 2016-08-04 ENCOUNTER — Ambulatory Visit (INDEPENDENT_AMBULATORY_CARE_PROVIDER_SITE_OTHER): Payer: BLUE CROSS/BLUE SHIELD

## 2016-08-04 VITALS — BP 137/97 | HR 97

## 2016-08-04 DIAGNOSIS — M5126 Other intervertebral disc displacement, lumbar region: Secondary | ICD-10-CM

## 2016-08-04 DIAGNOSIS — M5416 Radiculopathy, lumbar region: Secondary | ICD-10-CM | POA: Diagnosis not present

## 2016-08-04 DIAGNOSIS — M5116 Intervertebral disc disorders with radiculopathy, lumbar region: Secondary | ICD-10-CM | POA: Diagnosis not present

## 2016-08-04 MED ORDER — LIDOCAINE HCL (PF) 1 % IJ SOLN
2.0000 mL | Freq: Once | INTRAMUSCULAR | Status: AC
  Administered 2016-08-04: 2 mL

## 2016-08-04 MED ORDER — METHYLPREDNISOLONE ACETATE 80 MG/ML IJ SUSP
80.0000 mg | Freq: Once | INTRAMUSCULAR | Status: AC
  Administered 2016-08-04: 80 mg

## 2016-08-04 NOTE — Progress Notes (Signed)
BELEN ZWAHLEN - 41 y.o. male MRN [id number]  Date of birth: Feb 12, 1976  Office Visit Note: Visit Date: 08/04/2016 PCP: Marva Panda, NP Referred by: Marva Panda, NP  Subjective: Chief Complaint  Patient presents with  . Lower Back - Pain   HPI: Mr. Hommel is a 41 year old gentleman followed by Dr. Ophelia Charter who requests a diagnostic and hopefully therapeutic right L3 transforaminal epidural steroid injection. Patient has MRI showing L3-for right foraminal and paracentral disc herniation. He has had a chronic history of off and on back pain. Today he reports pain across the back worse on the right side. The pain is pretty constant and he doesn't relate it to any specific movement or position. He gets shooting pain down both legs when turning the wrong way and bending. He says the shooting pain down both legs is rare. He denies any focal weakness.    ROS Otherwise per HPI.  Assessment & Plan: Visit Diagnoses:  1. Lumbar radiculopathy   2. Radiculopathy due to lumbar intervertebral disc disorder   3. HNP (herniated nucleus pulposus), lumbar     Plan: Findings:  Right L3 transforaminal epidural steroid injection with fluoroscopic guidance.    Meds & Orders:  Meds ordered this encounter  Medications  . lidocaine (PF) (XYLOCAINE) 1 % injection 2 mL  . methylPREDNISolone acetate (DEPO-MEDROL) injection 80 mg    Orders Placed This Encounter  Procedures  . XR C-ARM NO REPORT  . Epidural Steroid injection    Follow-up: Return for Dr. Ophelia Charter.   Procedures: No procedures performed  Lumbosacral Transforaminal Epidural Steroid Injection - Infraneural Approach with Fluoroscopic Guidance  Patient: ANDON VILLARD      Date of Birth: 1975/04/08 MRN: [id number] PCP: Marva Panda, NP      Visit Date: 08/04/2016   Universal Protocol:    Date/Time: 05/15/185:26 AM  Consent Given By: the patient  Position: PRONE   Additional Comments: Vital signs were  monitored before and after the procedure. Patient was prepped and draped in the usual sterile fashion. The correct patient, procedure, and site was verified.   Injection Procedure Details:  Procedure Site One Meds Administered:  Meds ordered this encounter  Medications  . lidocaine (PF) (XYLOCAINE) 1 % injection 2 mL  . methylPREDNISolone acetate (DEPO-MEDROL) injection 80 mg      Laterality: Right  Location/Site:  L3-L4  Needle size: 22 G  Needle type: Spinal  Needle Placement: Transforaminal  Findings:  -Contrast Used: 1 mL iohexol 180 mg iodine/mL   -Comments: Excellent flow of contrast along the nerve and into the epidural space.  Procedure Details: After squaring off the end-plates of the desired vertebral level to get a true AP view, the C-arm was obliqued to the painful side so that the superior articulating process is positioned about 1/3 the length of the inferior endplate.  The needle was aimed toward the junction of the superior articular process and the transverse process of the inferior vertebrae. The needle's initial entry is in the lower third of the foramen through Kambin's triangle. The soft tissues overlying this target were infiltrated with 2-3 ml. of 1% Lidocaine without Epinephrine.  The spinal needle was then inserted and advanced toward the target using a "trajectory" view along the fluoroscope beam.  Under AP and lateral visualization, the needle was advanced so it did not puncture dura and did not traverse medially beyond the 6 o'clock position of the pedicle. Bi-planar projections were used to confirm position. Aspiration was confirmed to be  negative for CSF and/or blood. A 1-2 ml. volume of Isovue-250 was injected and flow of contrast was noted at each level. Radiographs were obtained for documentation purposes.   After attaining the desired flow of contrast documented above, a 0.5 to 1.0 ml test dose of 0.25% Marcaine was injected into each respective  transforaminal space.  The patient was observed for 90 seconds post injection.  After no sensory deficits were reported, and normal lower extremity motor function was noted,   the above injectate was administered so that equal amounts of the injectate were placed at each foramen (level) into the transforaminal epidural space.   Additional Comments:  The patient tolerated the procedure well Dressing: Band-Aid    Post-procedure details: Patient was observed during the procedure. Post-procedure instructions were reviewed.  Patient left the clinic in stable condition.   Clinical History: No specialty comments available.  He reports that he has been smoking Cigarettes.  He started smoking about 32 years ago. He has a 60.00 pack-year smoking history. He has never used smokeless tobacco. No results for input(s): HGBA1C, LABURIC in the last 8760 hours.  Objective:  VS:  HT:    WT:   BMI:     BP:(!) 137/97  HR:97bpm  TEMP: ( )  RESP:98 % Physical Exam  Musculoskeletal:  Patient ambulates without aid with a forward flexed spine. He has good distal strength.    Ortho Exam Imaging: Xr C-arm No Report  Result Date: 08/04/2016 Please see Notes or Procedures tab for imaging impression.   Past Medical/Family/Surgical/Social History: Medications & Allergies reviewed per EMR Patient Active Problem List   Diagnosis Date Noted  . Radiculopathy due to lumbar intervertebral disc disorder 08/04/2016  . Macrocytic anemia 09/30/2015  . HNP (herniated nucleus pulposus), lumbar 09/27/2015  . Sepsis (HCC) 09/27/2015  . Alcohol use 09/27/2015  . Cellulitis of right lower leg 09/27/2015  . AKI (acute kidney injury) (HCC) 09/27/2015  . Tobacco abuse   . Bipolar disorder (HCC)   . Alcohol intoxication (HCC) 08/23/2012   Past Medical History:  Diagnosis Date  . Allergy   . Bipolar disorder (HCC)   . Pneumonia   . Tobacco abuse    Family History  Problem Relation Age of Onset  .  Hypertension Mother   . Hypertension Father   . Cancer Maternal Grandmother   . Heart disease Maternal Grandfather   . Cancer Paternal Grandmother   . Diabetes Paternal Grandmother   . Cancer Paternal Grandfather    Past Surgical History:  Procedure Laterality Date  . APPENDECTOMY    . HERNIA REPAIR    . SHOULDER ARTHROSCOPY     Social History   Occupational History  . Not on file.   Social History Main Topics  . Smoking status: Current Every Day Smoker    Packs/day: 2.00    Years: 30.00    Types: Cigarettes    Start date: 03/11/1984  . Smokeless tobacco: Never Used  . Alcohol use 3.6 oz/week    6 Cans of beer per week     Comment: former  . Drug use: No     Comment: adderall  . Sexual activity: Not on file

## 2016-08-04 NOTE — Progress Notes (Deleted)
Pain across lower back and worse on right side. Constant pain but doesn't relate to any certain movement or position that makes it worse. Shooting pains down both legs at times with turning wrong way and bending. Says this is rare.

## 2016-08-04 NOTE — Patient Instructions (Signed)

## 2016-08-05 NOTE — Procedures (Signed)
Lumbosacral Transforaminal Epidural Steroid Injection - Infraneural Approach with Fluoroscopic Guidance  Patient: Troy Ponce      Date of Birth: October 25, 1975 MRN: [id number] PCP: Marva PandaMillsaps, Kimberly, NP      Visit Date: 08/04/2016   Universal Protocol:    Date/Time: 05/15/185:26 AM  Consent Given By: the patient  Position: PRONE   Additional Comments: Vital signs were monitored before and after the procedure. Patient was prepped and draped in the usual sterile fashion. The correct patient, procedure, and site was verified.   Injection Procedure Details:  Procedure Site One Meds Administered:  Meds ordered this encounter  Medications  . lidocaine (PF) (XYLOCAINE) 1 % injection 2 mL  . methylPREDNISolone acetate (DEPO-MEDROL) injection 80 mg      Laterality: Right  Location/Site:  L3-L4  Needle size: 22 G  Needle type: Spinal  Needle Placement: Transforaminal  Findings:  -Contrast Used: 1 mL iohexol 180 mg iodine/mL   -Comments: Excellent flow of contrast along the nerve and into the epidural space.  Procedure Details: After squaring off the end-plates of the desired vertebral level to get a true AP view, the C-arm was obliqued to the painful side so that the superior articulating process is positioned about 1/3 the length of the inferior endplate.  The needle was aimed toward the junction of the superior articular process and the transverse process of the inferior vertebrae. The needle's initial entry is in the lower third of the foramen through Kambin's triangle. The soft tissues overlying this target were infiltrated with 2-3 ml. of 1% Lidocaine without Epinephrine.  The spinal needle was then inserted and advanced toward the target using a "trajectory" view along the fluoroscope beam.  Under AP and lateral visualization, the needle was advanced so it did not puncture dura and did not traverse medially beyond the 6 o'clock position of the pedicle. Bi-planar  projections were used to confirm position. Aspiration was confirmed to be negative for CSF and/or blood. A 1-2 ml. volume of Isovue-250 was injected and flow of contrast was noted at each level. Radiographs were obtained for documentation purposes.   After attaining the desired flow of contrast documented above, a 0.5 to 1.0 ml test dose of 0.25% Marcaine was injected into each respective transforaminal space.  The patient was observed for 90 seconds post injection.  After no sensory deficits were reported, and normal lower extremity motor function was noted,   the above injectate was administered so that equal amounts of the injectate were placed at each foramen (level) into the transforaminal epidural space.   Additional Comments:  The patient tolerated the procedure well Dressing: Band-Aid    Post-procedure details: Patient was observed during the procedure. Post-procedure instructions were reviewed.  Patient left the clinic in stable condition.

## 2018-02-05 ENCOUNTER — Emergency Department (HOSPITAL_COMMUNITY): Payer: Self-pay

## 2018-02-05 ENCOUNTER — Emergency Department (HOSPITAL_COMMUNITY)
Admission: EM | Admit: 2018-02-05 | Discharge: 2018-02-05 | Disposition: A | Discharge status: Home / Self Care | Payer: Self-pay | Attending: Emergency Medicine | Admitting: Emergency Medicine

## 2018-02-05 ENCOUNTER — Encounter (HOSPITAL_COMMUNITY): Payer: Self-pay | Admitting: Emergency Medicine

## 2018-02-05 ENCOUNTER — Other Ambulatory Visit: Payer: Self-pay

## 2018-02-05 DIAGNOSIS — Z79899 Other long term (current) drug therapy: Secondary | ICD-10-CM | POA: Insufficient documentation

## 2018-02-05 DIAGNOSIS — Y929 Unspecified place or not applicable: Secondary | ICD-10-CM | POA: Insufficient documentation

## 2018-02-05 DIAGNOSIS — F17210 Nicotine dependence, cigarettes, uncomplicated: Secondary | ICD-10-CM | POA: Insufficient documentation

## 2018-02-05 DIAGNOSIS — S52552A Other extraarticular fracture of lower end of left radius, initial encounter for closed fracture: Secondary | ICD-10-CM | POA: Insufficient documentation

## 2018-02-05 DIAGNOSIS — W11XXXA Fall on and from ladder, initial encounter: Secondary | ICD-10-CM | POA: Insufficient documentation

## 2018-02-05 DIAGNOSIS — Y9389 Activity, other specified: Secondary | ICD-10-CM | POA: Insufficient documentation

## 2018-02-05 DIAGNOSIS — Y999 Unspecified external cause status: Secondary | ICD-10-CM | POA: Insufficient documentation

## 2018-02-05 MED ORDER — OXYCODONE-ACETAMINOPHEN 5-325 MG PO TABS: 1.0000 | ORAL_TABLET | Freq: Four times a day (QID) | ORAL | 0 refills | Status: DC | PRN

## 2018-02-05 MED ORDER — MORPHINE SULFATE (PF) 4 MG/ML IV SOLN
4.0000 mg | Freq: Once | INTRAVENOUS | Status: AC
  Administered 2018-02-05: 4 mg via INTRAVENOUS
  Filled 2018-02-05: qty 1

## 2018-02-05 MED ORDER — HYDROMORPHONE HCL 1 MG/ML IJ SOLN
0.5000 mg | Freq: Once | INTRAMUSCULAR | Status: AC
  Administered 2018-02-05: 0.5 mg via INTRAVENOUS
  Filled 2018-02-05: qty 1

## 2018-02-05 NOTE — Discharge Instructions (Signed)
Please read attached information. If you experience any new or worsening signs or symptoms please return to the emergency room for evaluation. Please follow-up with your primary care provider or specialist as discussed. Please use medication prescribed only as directed and discontinue taking if you have any concerning signs or symptoms.   °

## 2018-02-05 NOTE — ED Provider Notes (Signed)
[facility] EMERGENCY DEPARTMENT Provider Note   CSN: [id number] Arrival date & time: 02/05/18  1191   History   Chief Complaint Chief Complaint  Patient presents with  . Wrist Pain   HPI Troy Ponce is a 42 y.o. male.  HPI    42 year old male presents today with complaints of left-sided wrist pain.  Patient notes he was up on a ladder yesterday when he fell off from approximately 15 feet.  He notes landing on his left hand and wrist.  He notes immediate pain.  Decreased range of motion of the wrist.  He denies any pain at the elbow shoulder neck or back, denies any other injuries.  Patient denies any loss of sensation strength or motor function of his fingers.  No history of injury to his hand.  Past Medical History:  Diagnosis Date  . Allergy   . Bipolar disorder (HCC)   . Pneumonia   . Tobacco abuse     Patient Active Problem List   Diagnosis Date Noted  . Radiculopathy due to lumbar intervertebral disc disorder 08/04/2016  . Macrocytic anemia 09/30/2015  . HNP (herniated nucleus pulposus), lumbar 09/27/2015  . Sepsis (HCC) 09/27/2015  . Alcohol use 09/27/2015  . Cellulitis of right lower leg 09/27/2015  . AKI (acute kidney injury) (HCC) 09/27/2015  . Tobacco abuse   . Bipolar disorder (HCC)   . Alcohol intoxication (HCC) 08/23/2012    Past Surgical History:  Procedure Laterality Date  . APPENDECTOMY    . HERNIA REPAIR    . SHOULDER ARTHROSCOPY          Home Medications    Prior to Admission medications   Medication Sig Start Date End Date Taking? Authorizing Provider  albuterol (PROVENTIL HFA;VENTOLIN HFA) 108 (90 BASE) MCG/ACT inhaler Inhale 2 puffs into the lungs every 4 (four) hours as needed for wheezing or shortness of breath (cough, shortness of breath or wheezing.). Patient taking differently: Inhale 2 puffs into the lungs every 4 (four) hours as needed for wheezing or shortness of breath.  03/11/14   Peyton Najjar, MD    amphetamine-dextroamphetamine (ADDERALL) 30 MG tablet Take 15-30 mg by mouth 2 (two) times daily.     [provider]  busPIRone (BUSPAR) 10 MG tablet Take 20 mg by mouth 3 (three) times daily. 03/13/16   [provider]  cetirizine (ZYRTEC) 10 MG tablet Take 10 mg by mouth daily as needed for allergies. Reported on 09/30/2015    [provider]  cyclobenzaprine (FLEXERIL) 10 MG tablet Take 1 tablet (10 mg total) by mouth 3 (three) times daily as needed for muscle spasms. 06/11/16   Kirtland Bouchard, PA-C  HYDROcodone-acetaminophen (NORCO/VICODIN) 5-325 MG tablet Take 1 tablet by mouth every 6 (six) hours as needed for moderate pain. 07/23/16   Eldred Manges, MD  ibuprofen (ADVIL,MOTRIN) 600 MG tablet Take 1 tablet (600 mg total) by mouth every 6 (six) hours as needed. 05/04/16   Loren Racer, MD  lamoTRIgine (LAMICTAL) 200 MG tablet Take 300 mg by mouth daily.    [provider]  oxyCODONE-acetaminophen (PERCOCET/ROXICET) 5-325 MG tablet Take 1 tablet by mouth every 6 (six) hours as needed. 02/05/18   Alpha Chouinard, Tinnie Gens, PA-C  ranitidine (ZANTAC) 75 MG tablet Take 75 mg by mouth daily as needed for heartburn.    [provider]  sildenafil (VIAGRA) 100 MG tablet Take 25-100 mg by mouth daily as needed for erectile dysfunction.     [provider]  traZODone (DESYREL) 50 MG tablet Take 50-100 mg by mouth at bedtime.    [provider]    Family History Family History  Problem Relation Age of Onset  . Hypertension Mother   . Hypertension Father   . Cancer Maternal Grandmother   . Heart disease Maternal Grandfather   . Cancer Paternal Grandmother   . Diabetes Paternal Grandmother   . Cancer Paternal Grandfather     Social History Social History   Tobacco Use  . Smoking status: Current Every Day Smoker    Packs/day: 2.00    Years: 30.00    Pack years: 60.00    Types: Cigarettes    Start date: 03/11/1984  . Smokeless tobacco:  Never Used  Substance Use Topics  . Alcohol use: Yes    Alcohol/week: 6.0 standard drinks    Types: 6 Cans of beer per week    Comment: former  . Drug use: No    Comment: adderall     Allergies   Other; Lisinopril; and Broccoli [brassica oleracea italica]   Review of Systems Review of Systems  All other systems reviewed and are negative.    Physical Exam Updated Vital Signs BP 112/77 (BP Location: Right Arm)   Pulse 99   Temp 98 F (36.7 C) (Oral)   Resp 16   Ht 5\' 10"  (1.778 m)   Wt 90.7 kg   SpO2 99%   BMI 28.70 kg/m   Physical Exam  Constitutional: He is oriented to person, place, and time. He appears well-developed and well-nourished.  HENT:  Head: Normocephalic and atraumatic.  Eyes: Pupils are equal, round, and reactive to light. Conjunctivae are normal. Right eye exhibits no discharge. Left eye exhibits no discharge. No scleral icterus.  Neck: Normal range of motion. No JVD present. No tracheal deviation present.  Pulmonary/Chest: Effort normal. No stridor.  Musculoskeletal:  Swelling noted at the dorsal left wrist with exquisite tenderness to palpation, no open wounds, tenderness palpation of the proximal hand sensation strength and motor function intact at the fingers, elbow and proximal forearm nontender to palpation, shoulder nontender, no tenderness to palpation of the neck or back  Neurological: He is alert and oriented to person, place, and time. Coordination normal.  Psychiatric: He has a normal mood and affect. His behavior is normal. Judgment and thought content normal.  Nursing note and vitals reviewed.    ED Treatments / Results  Labs (all labs ordered are listed, but only abnormal results are displayed) Labs Reviewed - No data to display  EKG None  Radiology Dg Wrist Complete Left  Result Date: 02/05/2018 CLINICAL DATA:  Fall from ladder yesterday with wrist pain and swelling, initial encounter EXAM: LEFT WRIST - COMPLETE 3+ VIEW  COMPARISON:  None. FINDINGS: There is an transverse fracture through the distal radial metaphysis with mild comminution identified. No definitive intra-articular involvement is seen. The distal ulna appears within normal limits. Carpal bones are unremarkable. No other focal abnormality is seen. IMPRESSION: Comminuted transverse fracture through the distal radial metaphysis without significant articular involvement. No other focal abnormality is seen. Electronically Signed   By: Alcide Clever M.D.   On: 02/05/2018 09:25   Dg Hand Complete Left  Result Date: 02/05/2018 CLINICAL DATA:  Fall from ladder yesterday with hand pain, initial encounter EXAM: LEFT HAND - COMPLETE 3+ VIEW COMPARISON:  None. FINDINGS: Distal radial fracture is again identified and stable. No other fracture is seen. No gross soft tissue abnormality is noted. IMPRESSION:  Distal radial fracture similar to that seen on prior wrist films. No other focal abnormality is noted. Electronically Signed   By: Alcide CleverMark  Lukens M.D.   On: 02/05/2018 09:26    Procedures Procedures (including critical care time)  Medications Ordered in ED Medications  morphine 4 MG/ML injection 4 mg (4 mg Intravenous Given 02/05/18 0819)  HYDROmorphone (DILAUDID) injection 0.5 mg (0.5 mg Intravenous Given 02/05/18 0959)     Initial Impression / Assessment and Plan / ED Course  I have reviewed the triage vital signs and the nursing notes.  Pertinent labs & imaging results that were available during my care of the patient were reviewed by me and considered in my medical decision making (see chart for details).     Labs:   Imaging: DG wrist complete left, DG hand complete left  Consults: Dr. Izora Ribasoley  Therapeutics: Morphine, Dilaudid  Discharge Meds: Percocet  Assessment/Plan: 42 year old male presents today with distal radius fracture.  He has no significant angulation pulse motor and sensation intact.  Case discussed with on-call hand surgeon, he will  follow-up as an outpatient.  He is given strict return precautions, he verbalized understanding and agreement to today's plan had no further questions or concerns.    Final Clinical Impressions(s) / ED Diagnoses   Final diagnoses:  Other closed extra-articular fracture of distal end of left radius, initial encounter    ED Discharge Orders         Ordered    oxyCODONE-acetaminophen (PERCOCET/ROXICET) 5-325 MG tablet  Every 6 hours PRN     02/05/18 1102           Eyvonne MechanicHedges, Johnnette Laux, PA-C 02/05/18 1107    Arby BarrettePfeiffer, Marcy, MD 02/05/18 1134

## 2018-02-05 NOTE — ED Triage Notes (Signed)
Pt arrives to ED from home with complaints of falling 15 foot off a ladder and landing on his left wrist. Pt reports this instance happened yesterday but has had increase pain and swelling after waking up this morning. Pt placed in position of comfort with bed locked and lowered, call bell in reach.

## 2018-02-05 NOTE — ED Notes (Signed)
Ortho at bedside.

## 2018-02-05 NOTE — ED Notes (Addendum)
Ortho tech paged and notified of need for splint.

## 2018-02-05 NOTE — ED Notes (Signed)
Patient verbalizes understanding of discharge instructions. Opportunity for questioning and answers were provided. Armband removed by staff, pt discharged from ED. Pt ambulatory to lobby.  

## 2018-02-05 NOTE — Progress Notes (Signed)
Orthopedic Tech Progress Note Patient Details:  Troy Ponce 1975-10-31 [id number]  Ortho Devices Type of Ortho Device: Arm sling, Sugartong splint Ortho Device/Splint Location: lue Ortho Device/Splint Interventions: Ordered, Application, Adjustment   Post Interventions Patient Tolerated: Well Instructions Provided: Care of device, Adjustment of device   Trinna PostMartinez, Audryna Wendt J 02/05/2018, 10:45 AM

## 2018-02-07 IMAGING — MR MR LUMBAR SPINE W/O CM
4 of 6 series · 19 of 48 positions shown · non-contrast
Comparison: None.

CLINICAL DATA: New onset of back pain beginning last week. Pain
extends to the right leg. Brief improvement after steroid treatment
but with recurrence of symptoms.

EXAM:
MRI LUMBAR SPINE WITHOUT CONTRAST
TECHNIQUE: Multiplanar, multisequence MR imaging of the lumbar spine was
performed. No intravenous contrast was administered.

[Series 2: T2 · sagittal · 4.0mm · 0.55mm/px · 4 of 14 slices shown (1 of 2)]
[im 1/14]
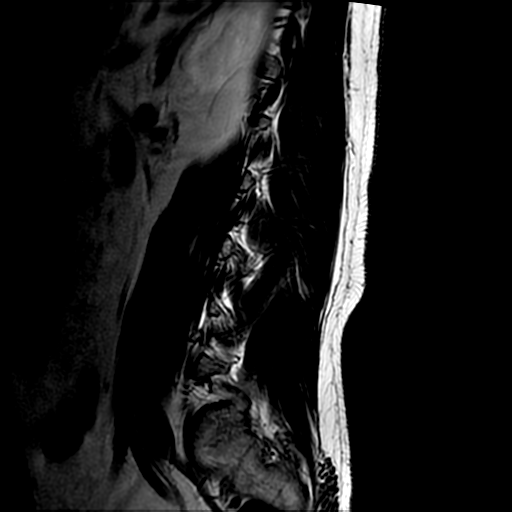
[im 5/14]
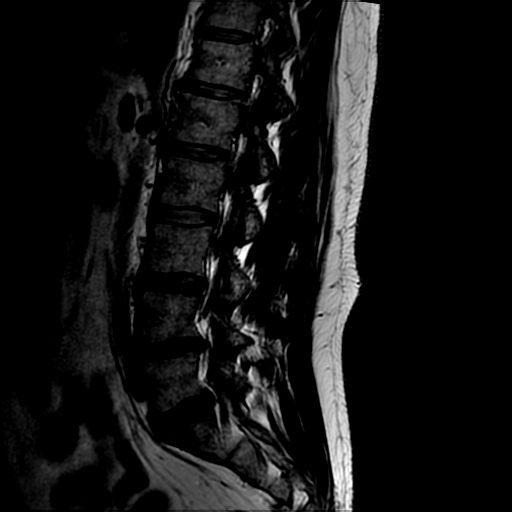
[im 9/14]
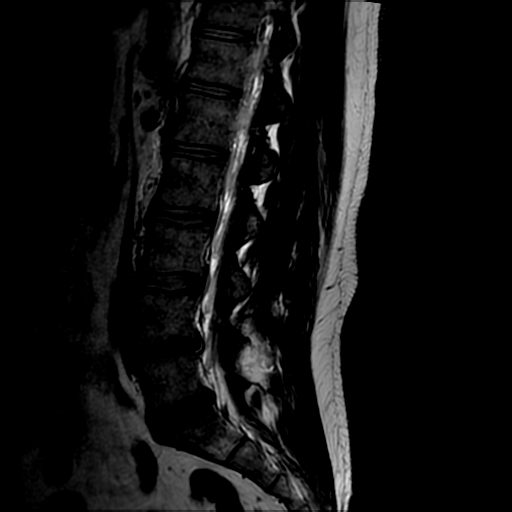
[im 14/14]
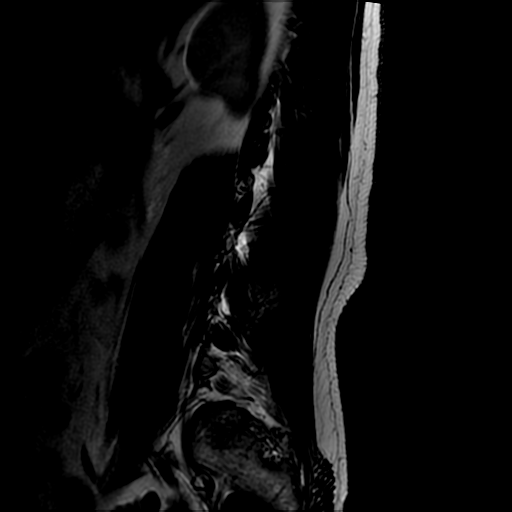

[Series 4: T1 · sagittal · 4.0mm · 0.55mm/px · 3 of 14 slices shown (1 of 2)]
[im 1/14]
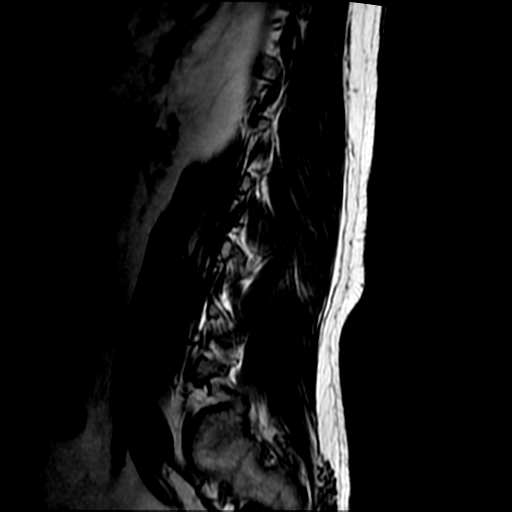
[im 9/14]
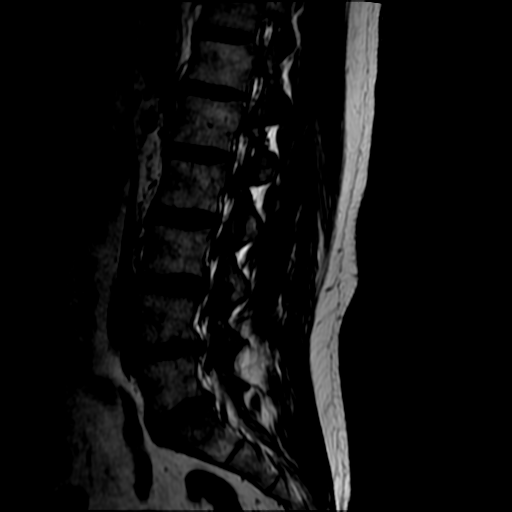
[im 14/14]
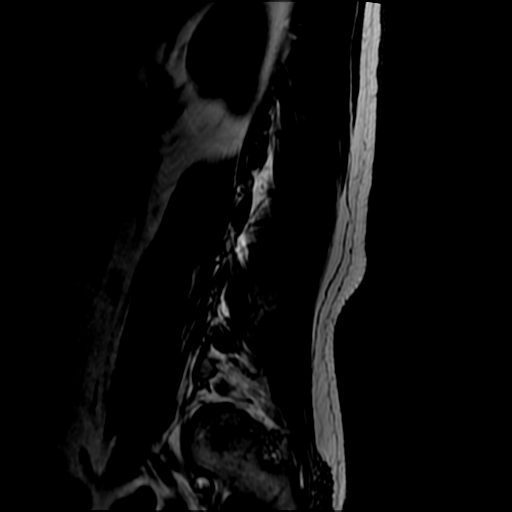

[Series 5: T2 · axial · 4.0mm · 0.39mm/px · z∈[-123,+91]mm · 9 of 44 slices shown (2 of 2)]
[im 1/44]
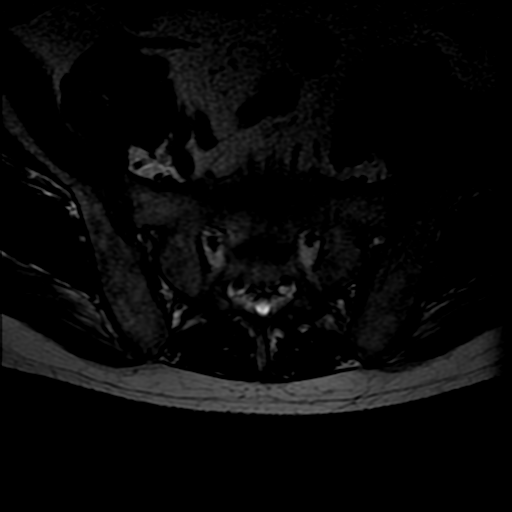
[im 8/44]
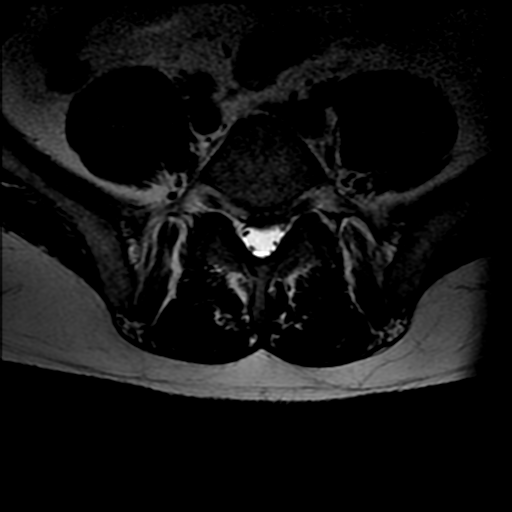
[im 12/44]
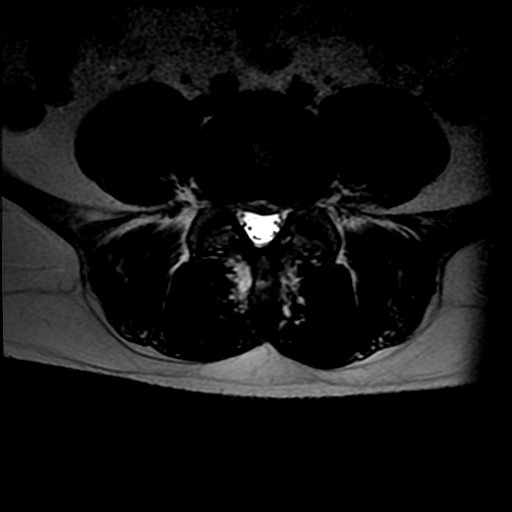
[im 20/44]
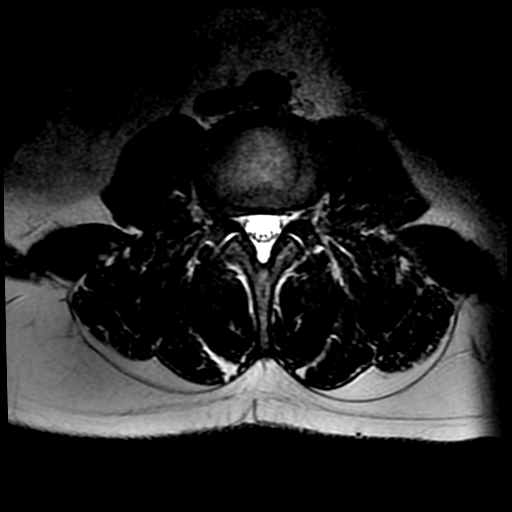
[im 24/44]
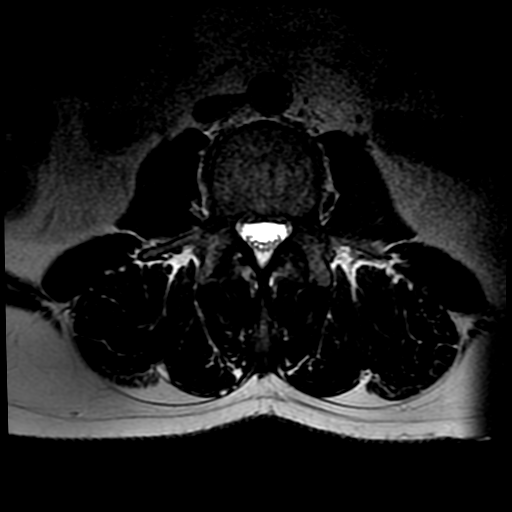
[im 32/44]
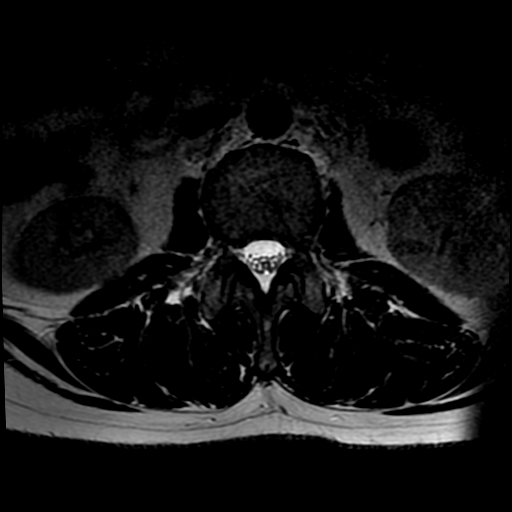
[im 36/44]
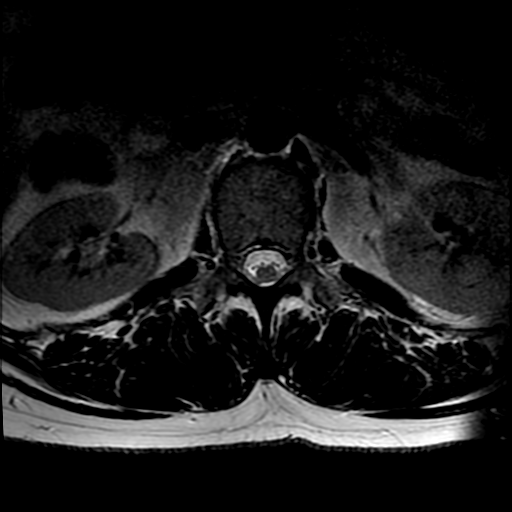
[im 40/44]
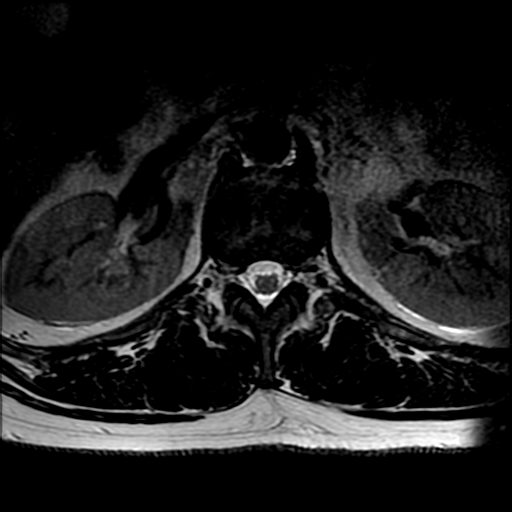
[im 44/44]
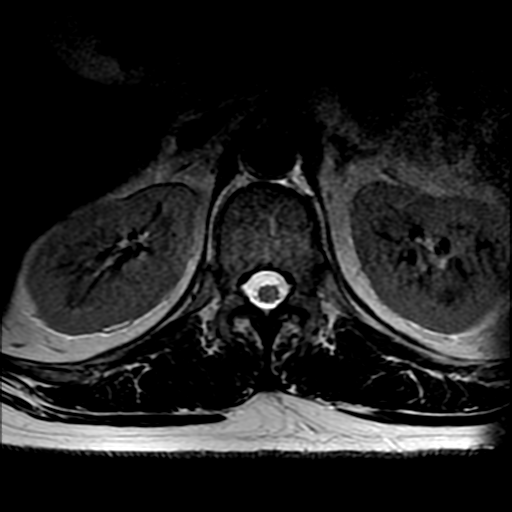

[Series 6: T1 · axial · 4.0mm · 0.39mm/px · z∈[-89,+71]mm · 3 of 44 slices shown (2 of 2)]
[im 8/44]
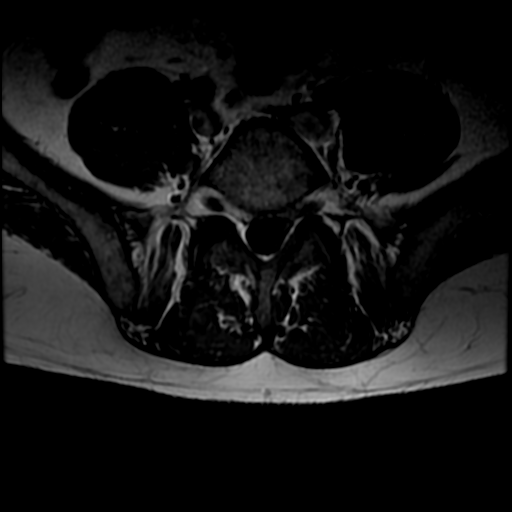
[im 24/44]
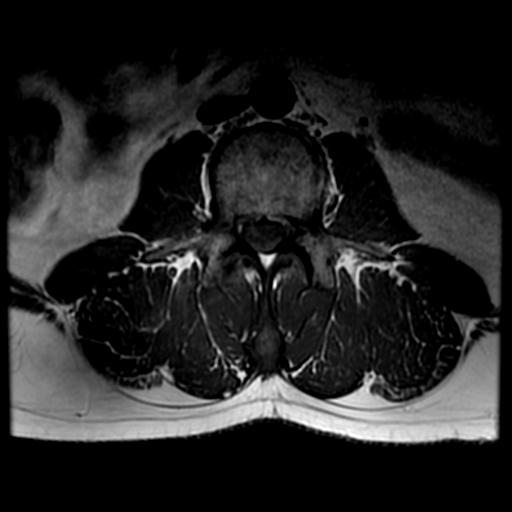
[im 40/44]
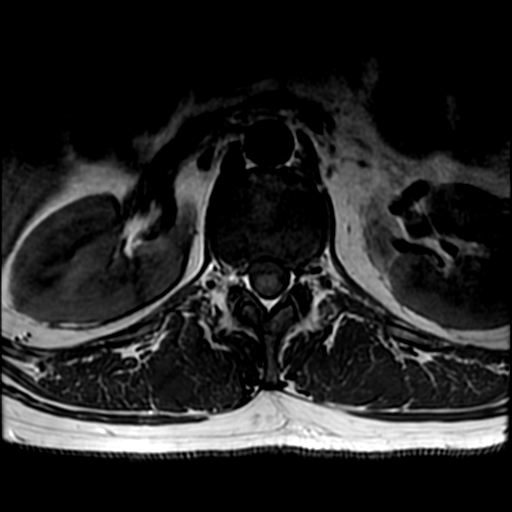

[19 of 48 positions shown; findings below may reference images not displayed]

FINDINGS: Segmentation:  5 lumbar type vertebral bodies.

Alignment:  Normal

Vertebrae:  No fracture or focal lesion.

Conus medullaris: Extends to the L1-2 level and appears normal.

Paraspinal and other soft tissues: Normal.

Disc levels: No abnormality at L2-3 and above. No degeneration. No
bulge or herniation. No stenosis.

L3-4: Right foraminal to extra foraminal disc herniation likely to
compress the right L3 nerve root.

L4-5: Disc degeneration with endplate osteophytes and bulging of the
disc. No central canal stenosis. Mild narrowing of the lateral
recesses and foramina without visible neural compression.

L5-S1: Disc degeneration with endplate osteophytes and shallow
protrusion of the disc centrally and towards the left. This contacts
the thecal sac in the left S1 nerve root. The left S1 nerve root
could be affected. Mild facet degeneration at this level.
IMPRESSION: The gait abnormality appears to be at the L3-4 level were there is a
right foraminal to extra foraminal disc herniation likely to
compress the right L3 nerve root.

Chronic disc degeneration at L4-5 but without definite neural
compression.

L5-S1: Chronic left-sided disc degeneration at L5-S1 with some
potential to affect the left S1 nerve root.

## 2018-02-23 ENCOUNTER — Emergency Department (HOSPITAL_COMMUNITY)
Admission: EM | Admit: 2018-02-23 | Discharge: 2018-02-23 | Disposition: A | Discharge status: Home / Self Care | Payer: Self-pay | Attending: Emergency Medicine | Admitting: Emergency Medicine

## 2018-02-23 ENCOUNTER — Emergency Department (HOSPITAL_COMMUNITY): Payer: Self-pay

## 2018-02-23 ENCOUNTER — Other Ambulatory Visit: Payer: Self-pay

## 2018-02-23 ENCOUNTER — Encounter (HOSPITAL_COMMUNITY): Payer: Self-pay | Admitting: Pharmacy Technician

## 2018-02-23 DIAGNOSIS — X58XXXA Exposure to other specified factors, initial encounter: Secondary | ICD-10-CM | POA: Insufficient documentation

## 2018-02-23 DIAGNOSIS — F17210 Nicotine dependence, cigarettes, uncomplicated: Secondary | ICD-10-CM | POA: Insufficient documentation

## 2018-02-23 DIAGNOSIS — Y999 Unspecified external cause status: Secondary | ICD-10-CM | POA: Insufficient documentation

## 2018-02-23 DIAGNOSIS — T1502XA Foreign body in cornea, left eye, initial encounter: Secondary | ICD-10-CM | POA: Insufficient documentation

## 2018-02-23 DIAGNOSIS — Y939 Activity, unspecified: Secondary | ICD-10-CM | POA: Insufficient documentation

## 2018-02-23 DIAGNOSIS — Y929 Unspecified place or not applicable: Secondary | ICD-10-CM | POA: Insufficient documentation

## 2018-02-23 DIAGNOSIS — T1592XA Foreign body on external eye, part unspecified, left eye, initial encounter: Secondary | ICD-10-CM

## 2018-02-23 DIAGNOSIS — S52502D Unspecified fracture of the lower end of left radius, subsequent encounter for closed fracture with routine healing: Secondary | ICD-10-CM | POA: Insufficient documentation

## 2018-02-23 MED ORDER — OXYCODONE HCL 5 MG PO TABS
10.0000 mg | ORAL_TABLET | Freq: Once | ORAL | Status: AC
  Administered 2018-02-23: 10 mg via ORAL
  Filled 2018-02-23: qty 2

## 2018-02-23 MED ORDER — TETRACAINE HCL 0.5 % OP SOLN
1.0000 [drp] | Freq: Once | OPHTHALMIC | Status: AC
  Administered 2018-02-23: 1 [drp] via OPHTHALMIC
  Filled 2018-02-23: qty 4

## 2018-02-23 MED ORDER — OXYCODONE HCL 5 MG PO TABS: 5.0000 mg | ORAL_TABLET | ORAL | 0 refills | Status: DC | PRN

## 2018-02-23 MED ORDER — FLUORESCEIN SODIUM 1 MG OP STRP
1.0000 | ORAL_STRIP | Freq: Once | OPHTHALMIC | Status: AC
  Administered 2018-02-23: 1 via OPHTHALMIC
  Filled 2018-02-23: qty 1

## 2018-02-23 MED ORDER — TOBRAMYCIN 0.3 % OP SOLN: 1.0000 [drp] | OPHTHALMIC | 0 refills | Status: AC
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
## 2018-02-23 NOTE — Discharge Instructions (Signed)
You were evaluated in the Emergency Department and after careful evaluation, we did not find any emergent condition requiring admission or further testing in the hospital.  Your symptoms today seem to be due to a foreign body in the eye, which was removed today.  It is very important that you take the antibiotic for your eye as directed and also follow-up with an ophthalmologist.  Dr. Christiana Fuchshris Shah is aware of your case and is happy to see you tomorrow morning at 10 AM at [facility] located on [address]  Please return to the Emergency Department if you experience any worsening of your condition.  We encourage you to follow up with a primary care provider.  Thank you for allowing us to be a part of your care.

## 2018-02-23 NOTE — ED Provider Notes (Signed)
[facility] Emergency Department Provider Note MRN:  [id number]  Arrival date & time: 02/23/18     Chief Complaint   Wrist Pain and Eye Pain   History of Present Illness   Troy Ponce is a 42 y.o. year-old male with a history of bipolar disorder presenting to the ED with chief complaint of wrist pain.  Patient fell off a ladder several weeks ago, sustained a distal radius fracture.  Was seen here in the emergency department, was referred to orthopedic surgery.  Patient explains that because he did not have insurance, the specialist did not really do anything for him.  Was advised to go to [facility] and buy a splint for his wrist.  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  Patient also endorsing eye irritation to the left eye.  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  The irritation has been ongoing for 3 to 4 weeks.  He thinks there is a foreign body in the eye.  Patient also endorsing significant distress due to his girlfriend breaking up with him recently.  Denies suicidal ideation, no homicidal ideation, no auditory or visual hallucinations.  Review of Systems  A complete 10 system review of systems was obtained and all systems are negative except as noted in the HPI and PMH.   Patient's Health History    Past Medical History:  Diagnosis Date  . Allergy   . Bipolar disorder (HCC)   . Pneumonia   . Tobacco abuse     Past Surgical History:  Procedure Laterality Date  . APPENDECTOMY    . HERNIA REPAIR    . SHOULDER ARTHROSCOPY      Family History  Problem Relation Age of Onset  . Hypertension Mother   . Hypertension Father   . Cancer Maternal Grandmother   . Heart disease Maternal Grandfather   . Cancer Paternal Grandmother   . Diabetes Paternal Grandmother   . Cancer Paternal Grandfather     Social History    Socioeconomic History  . Marital status: Single    Spouse name: Not on file  . Number of children: Not on file  . Years of education: Not on file  . Highest education level: Not on file  Occupational History  . Not on file  Social Needs  . Financial resource strain: Not on file  . Food insecurity:    Worry: Not on file    Inability: Not on file  . Transportation needs:    Medical: Not on file    Non-medical: Not on file  Tobacco Use  . Smoking status: Current Every Day Smoker    Packs/day: 2.00    Years: 30.00    Pack years: 60.00    Types: Cigarettes    Start date: 03/11/1984  . Smokeless tobacco: Never Used  Substance and Sexual Activity  . Alcohol use: Yes    Alcohol/week: 6.0 standard drinks    Types: 6 Cans of beer per week    Comment: former  . Drug use: No    Comment: adderall  . Sexual activity: Not on file  Lifestyle  . Physical activity:    Days per week: Not on file    Minutes per session: Not on file  . Stress: Not on file  Relationships  . Social connections:    Talks on phone: Not on file  Gets together: Not on file    Attends religious service: Not on file    Active member of club or organization: Not on file    Attends meetings of clubs or organizations: Not on file    Relationship status: Not on file  . Intimate partner violence:    Fear of current or ex partner: Not on file    Emotionally abused: Not on file    Physically abused: Not on file    Forced sexual activity: Not on file  Other Topics Concern  . Not on file  Social History Narrative  . Not on file     Physical Exam  Vital Signs and Nursing Notes reviewed Vitals:   02/23/18 1820 02/23/18 1830  BP: 121/78 109/73  Pulse: 96 62  Resp: 18 18  Temp: 98.2 F (36.8 C) 97.9 F (36.6 C)  SpO2: 98% 100%    CONSTITUTIONAL: Well-appearing, NAD NEURO:  Alert and oriented x 3, no focal deficits EYES:  eyes equal and reactive; normal extraocular movements, embedded white foreign  body of the left conjunctiva at the 3 o'clock position ENT/NECK:  no LAD, no JVD CARDIO: Regular rate, well-perfused, normal S1 and S2 PULM:  CTAB no wheezing or rhonchi GI/GU:  normal bowel sounds, non-distended, non-tender MSK/SPINE:  No gross deformities, no edema SKIN:  no rash, atraumatic PSYCH:  Appropriate speech and behavior  Diagnostic and Interventional Summary    EKG Interpretation  Date/Time:    Ventricular Rate:    PR Interval:    QRS Duration:   QT Interval:    QTC Calculation:   R Axis:     Text Interpretation:        Labs Reviewed - No data to display  DG Wrist Complete Left  Final Result      Medications  tetracaine (PONTOCAINE) 0.5 % ophthalmic solution 1 drop (1 drop Left Eye Given 02/23/18 1713)  fluorescein ophthalmic strip 1 strip (1 strip Left Eye Given 02/23/18 1713)  oxyCODONE (Oxy IR/ROXICODONE) immediate release tablet 10 mg (10 mg Oral Given 02/23/18 1632)     .Foreign Body Removal Date/Time: 02/23/2018 7:04 PM Performed by: Sabas Sous, MD Authorized by: Sabas Sous, MD  Consent: Verbal consent obtained. Risks and benefits: risks, benefits and alternatives were discussed Consent given by: patient Patient understanding: patient states understanding of the procedure being performed Body area: eye Location details: left conjunctiva Anesthesia: local infiltration  Anesthesia: Local Anesthetic: topical anesthetic and tetracaine drops  Sedation: Patient sedated: no  Patient restrained: no Patient cooperative: yes Localization method: visualized Removal mechanism: moist cotton swab Eye examined with fluorescein. Fluorescein uptake (conjunctival). No residual rust ring present. Depth: superficial Complexity: simple 1 objects recovered. Objects recovered: small white piece of dry wall or metal Post-procedure assessment: foreign body removed Patient tolerance: Patient tolerated the procedure well with no immediate  complications Comments: Fluorescein was applied both before and after removal of foreign body, with no evidence of Seidel sign or open globe   Critical Care  ED Course and Medical Decision Making  I have reviewed the triage vital signs and the nursing notes.  Pertinent labs & imaging results that were available during my care of the patient were reviewed by me and considered in my medical decision making (see below for details).  We will x-ray to evaluate for worsened fracture given the patient's continued use of the fractured wrist.  Will evaluate for corneal abrasion and/or open globe.  Will likely need removal of foreign body  of the eye.  Foreign body removed as described above.  X-ray with no significant changes.  Patient advised to rest the wrist and use the brace as directed by his orthopedic specialist.  Prescription for tobramycin.  [facility] has agreed to evaluate the patient tomorrow morning.  After the discussed management above, the patient was determined to be safe for discharge.  The patient was in agreement with this plan and all questions regarding their care were answered.  ED return precautions were discussed and the patient will return to the ED with any significant worsening of condition.   Elmer SowMichael M. Pilar PlateBero, MD [facility] [email or web address]  Final Clinical Impressions(s) / ED Diagnoses     ICD-10-CM   1. Closed fracture of distal end of left radius with routine healing, unspecified fracture morphology, subsequent encounter S52.502D   2. Foreign body of left eye, initial encounter T15.92XA     ED Discharge Orders         Ordered    tobramycin (TOBREX) 0.3 % ophthalmic solution  Every 4 hours     02/23/18 1820    oxyCODONE (ROXICODONE) 5 MG immediate release tablet  Every 4 hours PRN     02/23/18 1820             Sabas SousBero, Nate Perri M, MD 02/23/18 1908

## 2018-05-11 ENCOUNTER — Emergency Department (HOSPITAL_COMMUNITY)
Admission: EM | Admit: 2018-05-11 | Discharge: 2018-05-11 | Disposition: A | Discharge status: Home / Self Care | Payer: Self-pay | Attending: Emergency Medicine | Admitting: Emergency Medicine

## 2018-05-11 ENCOUNTER — Emergency Department (HOSPITAL_COMMUNITY): Payer: Self-pay

## 2018-05-11 ENCOUNTER — Other Ambulatory Visit: Payer: Self-pay

## 2018-05-11 ENCOUNTER — Encounter (HOSPITAL_COMMUNITY): Payer: Self-pay | Admitting: Emergency Medicine

## 2018-05-11 DIAGNOSIS — F17210 Nicotine dependence, cigarettes, uncomplicated: Secondary | ICD-10-CM | POA: Insufficient documentation

## 2018-05-11 DIAGNOSIS — R062 Wheezing: Secondary | ICD-10-CM | POA: Insufficient documentation

## 2018-05-11 DIAGNOSIS — R05 Cough: Secondary | ICD-10-CM | POA: Insufficient documentation

## 2018-05-11 DIAGNOSIS — R059 Cough, unspecified: Secondary | ICD-10-CM

## 2018-05-11 LAB — CBC
HEMATOCRIT: 48.1 % (ref 39.0–52.0)
Hemoglobin: 16 g/dL (ref 13.0–17.0)
MCH: 33 pg (ref 26.0–34.0)
MCHC: 33.3 g/dL (ref 30.0–36.0)
MCV: 99.2 fL (ref 80.0–100.0)
Platelets: 206 10*3/uL (ref 150–400)
RBC: 4.85 MIL/uL (ref 4.22–5.81)
RDW: 13 % (ref 11.5–15.5)
WBC: 11.8 10*3/uL — AB (ref 4.0–10.5)
nRBC: 0 % (ref 0.0–0.2)

## 2018-05-11 LAB — BASIC METABOLIC PANEL
ANION GAP: 14 (ref 5–15)
BUN: 10 mg/dL (ref 6–20)
CHLORIDE: 100 mmol/L (ref 98–111)
CO2: 23 mmol/L (ref 22–32)
CREATININE: 0.75 mg/dL (ref 0.61–1.24)
Calcium: 9.4 mg/dL (ref 8.9–10.3)
Glucose, Bld: 102 mg/dL — ABNORMAL HIGH (ref 70–99)
POTASSIUM: 3.9 mmol/L (ref 3.5–5.1)
SODIUM: 137 mmol/L (ref 135–145)

## 2018-05-11 LAB — I-STAT TROPONIN, ED: Troponin i, poc: 0 ng/mL (ref 0.00–0.08)

## 2018-05-11 MED ORDER — ALBUTEROL SULFATE (2.5 MG/3ML) 0.083% IN NEBU
5.0000 mg | INHALATION_SOLUTION | Freq: Once | RESPIRATORY_TRACT | Status: AC
  Administered 2018-05-11: 5 mg via RESPIRATORY_TRACT
  Filled 2018-05-11: qty 6

## 2018-05-11 MED ORDER — SODIUM CHLORIDE 0.9% FLUSH: 3.0000 mL | Freq: Once | INTRAVENOUS | Status: DC

## 2018-05-11 MED ORDER — ALBUTEROL SULFATE HFA 108 (90 BASE) MCG/ACT IN AERS: 1.0000 | INHALATION_SPRAY | Freq: Four times a day (QID) | RESPIRATORY_TRACT | 0 refills | Status: AC | PRN

## 2018-05-11 MED ORDER — PREDNISONE 10 MG PO TABS: 20.0000 mg | ORAL_TABLET | Freq: Every day | ORAL | 0 refills | Status: AC

## 2018-05-11 MED ORDER — DOXYCYCLINE HYCLATE 100 MG PO CAPS: 100.0000 mg | ORAL_CAPSULE | Freq: Two times a day (BID) | ORAL | 0 refills | Status: DC

## 2018-05-11 MED ORDER — PREDNISONE 20 MG PO TABS
60.0000 mg | ORAL_TABLET | Freq: Once | ORAL | Status: AC
  Administered 2018-05-11: 60 mg via ORAL
  Filled 2018-05-11: qty 3

## 2018-05-11 MED ORDER — IPRATROPIUM-ALBUTEROL 0.5-2.5 (3) MG/3ML IN SOLN
6.0000 mL | Freq: Once | RESPIRATORY_TRACT | Status: AC
  Administered 2018-05-11: 6 mL via RESPIRATORY_TRACT
  Filled 2018-05-11: qty 3

## 2018-05-11 MED ORDER — BENZONATATE 100 MG PO CAPS: 100.0000 mg | ORAL_CAPSULE | Freq: Three times a day (TID) | ORAL | 0 refills | Status: DC

## 2018-05-11 NOTE — Discharge Instructions (Addendum)
I have provided an inhaler to help with your symptoms, please use this as directed.I have also prescribed medication to help with your cough, please take this as directed. Antibiotics have been prescribed please take one tablet twice a day for the next 7 days. Follow up with your PCP after completion of therapy. If you experience any chest pain, worsening symptoms return to the ED.

## 2018-05-11 NOTE — ED Notes (Signed)
Patient verbalizes understanding of discharge instructions. Opportunity for questioning and answers were provided. Armband removed by staff, pt discharged from ED.  

## 2018-05-11 NOTE — ED Triage Notes (Signed)
Pt reports productive cough x 1 month, CP that feels like it is located in his "lungs".

## 2018-05-11 NOTE — ED Provider Notes (Signed)
[facility] EMERGENCY DEPARTMENT Provider Note   CSN: [id number] Arrival date & time: 05/11/18  1321    History   Chief Complaint Chief Complaint  Patient presents with  . Cough  . Chest Pain    HPI PAVAN BRING is a 43 y.o. male.     43 y.o male with a PMH of AKI, tobacco abuse presents to the ED with a chief complaint of cough and shortness of breath x 1 month. Patient reports he feels "like my lungs are on fire", he reports a productive cough with a thick sputum.  Patient currently works with dust and outdoors.  He is also a heavy smoker.  He also endorses some wheezing which woke him up last night as he got more severe.  Endorses shortness of breath with ambulation and during coughing attacks.  Has not tried any medical therapy for relieving symptoms.  Does endorse some chest pain worse with coughing.  He denies any abdominal pain, fevers, difficulty swallowing or other complaints.     Past Medical History:  Diagnosis Date  . Allergy   . Bipolar disorder (HCC)   . Pneumonia   . Tobacco abuse     Patient Active Problem List   Diagnosis Date Noted  . Radiculopathy due to lumbar intervertebral disc disorder 08/04/2016  . Macrocytic anemia 09/30/2015  . HNP (herniated nucleus pulposus), lumbar 09/27/2015  . Sepsis (HCC) 09/27/2015  . Alcohol use 09/27/2015  . Cellulitis of right lower leg 09/27/2015  . AKI (acute kidney injury) (HCC) 09/27/2015  . Tobacco abuse   . Bipolar disorder (HCC)   . Alcohol intoxication (HCC) 08/23/2012    Past Surgical History:  Procedure Laterality Date  . APPENDECTOMY    . HERNIA REPAIR    . SHOULDER ARTHROSCOPY          Home Medications    Prior to Admission medications   Medication Sig Start Date End Date Taking? Authorizing Provider  albuterol (PROVENTIL HFA;VENTOLIN HFA) 108 (90 Base) MCG/ACT inhaler Inhale 1-2 puffs into the lungs every 6 (six) hours as needed for wheezing or shortness of breath.  05/11/18   Claude Manges, PA-C  benzonatate (TESSALON) 100 MG capsule Take 1 capsule (100 mg total) by mouth every 8 (eight) hours for 7 days. 05/11/18 05/18/18  Claude Manges, PA-C  cyclobenzaprine (FLEXERIL) 10 MG tablet Take 1 tablet (10 mg total) by mouth 3 (three) times daily as needed for muscle spasms. Patient not taking: Reported on 05/11/2018 06/11/16   Kirtland Bouchard, PA-C  doxycycline (VIBRAMYCIN) 100 MG capsule Take 1 capsule (100 mg total) by mouth 2 (two) times daily for 7 days. 05/11/18 05/18/18  Claude Manges, PA-C  HYDROcodone-acetaminophen (NORCO/VICODIN) 5-325 MG tablet Take 1 tablet by mouth every 6 (six) hours as needed for moderate pain. Patient not taking: Reported on 05/11/2018 07/23/16   Eldred Manges, MD  ibuprofen (ADVIL,MOTRIN) 600 MG tablet Take 1 tablet (600 mg total) by mouth every 6 (six) hours as needed. Patient not taking: Reported on 05/11/2018 05/04/16   Loren Racer, MD  oxyCODONE (ROXICODONE) 5 MG immediate release tablet Take 1 tablet (5 mg total) by mouth every 4 (four) hours as needed for severe pain. Patient not taking: Reported on 05/11/2018 02/23/18   Sabas Sous, MD  oxyCODONE-acetaminophen (PERCOCET/ROXICET) 5-325 MG tablet Take 1 tablet by mouth every 6 (six) hours as needed. Patient not taking: Reported on 05/11/2018 02/05/18   Hedges, Tinnie Gens, PA-C  predniSONE (DELTASONE) 10 MG tablet  Take 2 tablets (20 mg total) by mouth daily for 4 days. 05/11/18 05/15/18  Claude Manges, PA-C    Family History Family History  Problem Relation Age of Onset  . Hypertension Mother   . Hypertension Father   . Cancer Maternal Grandmother   . Heart disease Maternal Grandfather   . Cancer Paternal Grandmother   . Diabetes Paternal Grandmother   . Cancer Paternal Grandfather     Social History Social History   Tobacco Use  . Smoking status: Current Every Day Smoker    Packs/day: 2.00    Years: 30.00    Pack years: 60.00    Types: Cigarettes    Start date:  03/11/1984  . Smokeless tobacco: Never Used  Substance Use Topics  . Alcohol use: Yes    Alcohol/week: 6.0 standard drinks    Types: 6 Cans of beer per week    Comment: former  . Drug use: No    Comment: adderall     Allergies   Other; Lisinopril; and Broccoli [brassica oleracea italica]   Review of Systems Review of Systems  Constitutional: Negative for chills and fever.  HENT: Negative for ear pain and sore throat.   Eyes: Negative for pain and visual disturbance.  Respiratory: Positive for cough, chest tightness and shortness of breath.   Cardiovascular: Negative for chest pain and palpitations.  Gastrointestinal: Negative for abdominal pain, diarrhea, nausea and vomiting.  Genitourinary: Negative for dysuria and hematuria.  Musculoskeletal: Negative for arthralgias and back pain.  Skin: Negative for color change and rash.  Neurological: Negative for seizures and syncope.  All other systems reviewed and are negative.    Physical Exam Updated Vital Signs BP (!) 149/86   Pulse 93   Temp 98.6 F (37 C) (Oral)   Resp 18   Ht 5\' 10"  (1.778 m)   Wt 89.8 kg   SpO2 93%   BMI 28.41 kg/m   Physical Exam Vitals signs and nursing note reviewed.  Constitutional:      Appearance: He is well-developed.  HENT:     Head: Normocephalic and atraumatic.     Nose:     Right Sinus: No maxillary sinus tenderness.     Left Sinus: No maxillary sinus tenderness or frontal sinus tenderness.     Comments: No nasal congestion appreciated.    Mouth/Throat:     Comments: No erythema, edema, tonsillar exudates or abscess to the oropharynx. Eyes:     General: No scleral icterus.    Pupils: Pupils are equal, round, and reactive to light.  Neck:     Musculoskeletal: Normal range of motion.  Cardiovascular:     Heart sounds: Normal heart sounds.  Pulmonary:     Effort: Pulmonary effort is normal.     Breath sounds: Examination of the right-middle field reveals decreased breath  sounds. Examination of the right-lower field reveals decreased breath sounds. Decreased breath sounds and wheezing present.  Chest:     Chest wall: No tenderness.  Abdominal:     General: Bowel sounds are normal. There is no distension.     Palpations: Abdomen is soft.     Tenderness: There is no abdominal tenderness.     Comments: Abdomen region appears distended but not tender to palpation.  Musculoskeletal:        General: No tenderness or deformity.  Skin:    General: Skin is warm and dry.  Neurological:     Mental Status: He is alert and oriented to person, place,  and time.      ED Treatments / Results  Labs (all labs ordered are listed, but only abnormal results are displayed) Labs Reviewed  BASIC METABOLIC PANEL - Abnormal; Notable for the following components:      Result Value   Glucose, Bld 102 (*)    All other components within normal limits  CBC - Abnormal; Notable for the following components:   WBC 11.8 (*)    All other components within normal limits  I-STAT TROPONIN, ED    EKG EKG Interpretation  Date/Time:  Tuesday May 11 2018 13:30:46 EST Ventricular Rate:  104 PR Interval:  184 QRS Duration: 76 QT Interval:  320 QTC Calculation: 420 R Axis:   69 Text Interpretation:  Sinus tachycardia Otherwise normal ECG No significant change since last tracing Confirmed by Cathren Laine ([id number]) on 05/11/2018 8:24:34 PM   Radiology Dg Chest 2 View  Result Date: 05/11/2018 CLINICAL DATA:  Shortness of breath and cough. EXAM: CHEST - 2 VIEW COMPARISON:  September 27, 2015 FINDINGS: The heart size and mediastinal contours are within normal limits. Both lungs are clear. The visualized skeletal structures are unremarkable. IMPRESSION: No active cardiopulmonary disease. Electronically Signed   By: Gerome Sam III M.D   On: 05/11/2018 16:09    Procedures Procedures (including critical care time)  Medications Ordered in ED Medications  sodium chloride flush (NS)  0.9 % injection 3 mL (has no administration in time range)  albuterol (PROVENTIL) (2.5 MG/3ML) 0.083% nebulizer solution 5 mg (has no administration in time range)  albuterol (PROVENTIL) (2.5 MG/3ML) 0.083% nebulizer solution 5 mg (5 mg Nebulization Given 05/11/18 1339)  ipratropium-albuterol (DUONEB) 0.5-2.5 (3) MG/3ML nebulizer solution 6 mL (6 mLs Nebulization Given 05/11/18 2136)  predniSONE (DELTASONE) tablet 60 mg (60 mg Oral Given 05/11/18 2136)     Initial Impression / Assessment and Plan / ED Course  I have reviewed the triage vital signs and the nursing notes.  Pertinent labs & imaging results that were available during my care of the patient were reviewed by me and considered in my medical decision making (see chart for details).       Patient presents to the ED with productive cough, wheezing, shortness of breath x1 month.  Patient has not tried any medical therapy for relieving symptoms.  Reports last time an episode like this happened he was placed on antibiotics for pneumonia.  During evaluation patient does have wheezing to auscultation on the right lung fields.  He will be provided with DuoNeb treatment to help relieve his wheezing.  Deltasone has also been given 60 mg to help with opening up airway. CBC shows slight leukocytosis 11.8, hemoglobin within normal limits.  BMP showed an slight elevated glucose, no electrolyte dysfunction, creatinine is within normal levels.  Did report some chest tightness from coughing, troponin was negative.  Chest 2 view showed: No consolidation, pneumothorax, pleural effusion.  He denies any history of asthma, COPD, according to patient's records which I have reviewed he has no visits for pneumonia to the emergency department. Patient received 3 breathing treatments, will send home on antibiotics as he reports recurrent bronchitis and prednisone burst, also provided with inhaler along with tessalon pearls for his cough.   Final Clinical  Impressions(s) / ED Diagnoses   Final diagnoses:  Cough  Wheezing    ED Discharge Orders         Ordered    albuterol (PROVENTIL HFA;VENTOLIN HFA) 108 (90 Base) MCG/ACT inhaler  Every 6 hours  PRN     05/11/18 2154    benzonatate (TESSALON) 100 MG capsule  Every 8 hours     05/11/18 2155    doxycycline (VIBRAMYCIN) 100 MG capsule  2 times daily     05/11/18 2200    predniSONE (DELTASONE) 10 MG tablet  Daily     05/11/18 2200           Claude MangesSoto, Gowri Suchan, PA-C 05/11/18 2202    Cathren LaineSteinl, Kevin, MD 05/11/18 2300

## 2018-05-16 ENCOUNTER — Encounter (HOSPITAL_COMMUNITY): Payer: Self-pay

## 2018-05-16 ENCOUNTER — Other Ambulatory Visit: Payer: Self-pay

## 2018-05-16 ENCOUNTER — Emergency Department (HOSPITAL_COMMUNITY)
Admission: EM | Admit: 2018-05-16 | Discharge: 2018-05-17 | Disposition: A | Discharge status: Home / Self Care | Payer: Self-pay | Attending: Emergency Medicine | Admitting: Emergency Medicine

## 2018-05-16 DIAGNOSIS — F1014 Alcohol abuse with alcohol-induced mood disorder: Secondary | ICD-10-CM | POA: Diagnosis present

## 2018-05-16 DIAGNOSIS — F10920 Alcohol use, unspecified with intoxication, uncomplicated: Secondary | ICD-10-CM | POA: Insufficient documentation

## 2018-05-16 DIAGNOSIS — F17210 Nicotine dependence, cigarettes, uncomplicated: Secondary | ICD-10-CM | POA: Insufficient documentation

## 2018-05-16 DIAGNOSIS — R45851 Suicidal ideations: Secondary | ICD-10-CM | POA: Insufficient documentation

## 2018-05-16 DIAGNOSIS — R748 Abnormal levels of other serum enzymes: Secondary | ICD-10-CM | POA: Insufficient documentation

## 2018-05-16 HISTORY — DX: Alcohol abuse, uncomplicated: F10.10

## 2018-05-16 HISTORY — DX: Alcohol dependence, uncomplicated: F10.20

## 2018-05-16 LAB — CBC
HCT: 50.6 % (ref 39.0–52.0)
Hemoglobin: 16.6 g/dL (ref 13.0–17.0)
MCH: 33.7 pg (ref 26.0–34.0)
MCHC: 32.8 g/dL (ref 30.0–36.0)
MCV: 102.8 fL — ABNORMAL HIGH (ref 80.0–100.0)
NRBC: 0.2 % (ref 0.0–0.2)
Platelets: 249 10*3/uL (ref 150–400)
RBC: 4.92 MIL/uL (ref 4.22–5.81)
RDW: 13.2 % (ref 11.5–15.5)
WBC: 9.5 10*3/uL (ref 4.0–10.5)

## 2018-05-16 LAB — COMPREHENSIVE METABOLIC PANEL
ALT: 200 U/L — ABNORMAL HIGH (ref 0–44)
AST: 234 U/L — ABNORMAL HIGH (ref 15–41)
Albumin: 4.7 g/dL (ref 3.5–5.0)
Alkaline Phosphatase: 69 U/L (ref 38–126)
Anion gap: 9 (ref 5–15)
BUN: 11 mg/dL (ref 6–20)
CO2: 25 mmol/L (ref 22–32)
Calcium: 8.8 mg/dL — ABNORMAL LOW (ref 8.9–10.3)
Chloride: 107 mmol/L (ref 98–111)
Creatinine, Ser: 0.85 mg/dL (ref 0.61–1.24)
GFR calc Af Amer: >60 mL/min (ref >60)
Glucose, Bld: 105 mg/dL — ABNORMAL HIGH (ref 70–99)
Potassium: 3.7 mmol/L (ref 3.5–5.1)
Sodium: 141 mmol/L (ref 135–145)
Total Bilirubin: 0.7 mg/dL (ref 0.3–1.2)
Total Protein: 8 g/dL (ref 6.5–8.1)

## 2018-05-16 LAB — ETHANOL: Alcohol, Ethyl (B): 288 mg/dL — ABNORMAL HIGH (ref <10)

## 2018-05-16 LAB — RAPID URINE DRUG SCREEN, HOSP PERFORMED
Amphetamines: NOT DETECTED
BENZODIAZEPINES: POSITIVE — AB
Barbiturates: NOT DETECTED
Cocaine: NOT DETECTED
Opiates: NOT DETECTED
Tetrahydrocannabinol: NOT DETECTED

## 2018-05-16 LAB — ACETAMINOPHEN LEVEL: Acetaminophen (Tylenol), Serum: <10 ug/mL — ABNORMAL LOW (ref 10–30)

## 2018-05-16 LAB — SALICYLATE LEVEL: Salicylate Lvl: <7 mg/dL (ref 2.8–30.0)

## 2018-05-16 MED ORDER — NICOTINE 21 MG/24HR TD PT24
21.0000 mg | MEDICATED_PATCH | Freq: Every day | TRANSDERMAL | Status: DC
  Administered 2018-05-16: 21 mg via TRANSDERMAL
  Filled 2018-05-16: qty 1

## 2018-05-16 MED ORDER — VITAMIN B-1 100 MG PO TABS
100.0000 mg | ORAL_TABLET | Freq: Every day | ORAL | Status: DC
  Administered 2018-05-16 – 2018-05-17 (×2): 100 mg via ORAL
  Filled 2018-05-16 (×2): qty 1

## 2018-05-16 MED ORDER — STERILE WATER FOR INJECTION IJ SOLN
INTRAMUSCULAR | Status: AC
  Filled 2018-05-16: qty 10

## 2018-05-16 MED ORDER — LORAZEPAM 2 MG/ML IJ SOLN
2.0000 mg | Freq: Once | INTRAMUSCULAR | Status: AC
  Administered 2018-05-16: 2 mg via INTRAMUSCULAR

## 2018-05-16 MED ORDER — LORAZEPAM 2 MG/ML IJ SOLN: 0.0000 mg | Freq: Four times a day (QID) | INTRAMUSCULAR | Status: DC

## 2018-05-16 MED ORDER — LORAZEPAM 2 MG/ML IJ SOLN: 0.0000 mg | Freq: Two times a day (BID) | INTRAMUSCULAR | Status: DC

## 2018-05-16 MED ORDER — LORAZEPAM 1 MG PO TABS
0.0000 mg | ORAL_TABLET | Freq: Four times a day (QID) | ORAL | Status: DC
  Administered 2018-05-16 (×2): 2 mg via ORAL
  Administered 2018-05-17 (×2): 1 mg via ORAL
  Filled 2018-05-16 (×2): qty 2
  Filled 2018-05-16 (×2): qty 1

## 2018-05-16 MED ORDER — THIAMINE HCL 100 MG/ML IJ SOLN: 100.0000 mg | Freq: Every day | INTRAMUSCULAR | Status: DC

## 2018-05-16 MED ORDER — LORAZEPAM 1 MG PO TABS: 0.0000 mg | ORAL_TABLET | Freq: Two times a day (BID) | ORAL | Status: DC

## 2018-05-16 MED ORDER — LORAZEPAM 2 MG/ML IJ SOLN
INTRAMUSCULAR | Status: AC
  Filled 2018-05-16: qty 1

## 2018-05-16 MED ORDER — ZIPRASIDONE MESYLATE 20 MG IM SOLR
INTRAMUSCULAR | Status: AC
  Filled 2018-05-16: qty 20

## 2018-05-16 MED ORDER — ZIPRASIDONE MESYLATE 20 MG IM SOLR
10.0000 mg | Freq: Once | INTRAMUSCULAR | Status: AC
  Administered 2018-05-16: 10 mg via INTRAMUSCULAR
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
## 2018-05-16 NOTE — BHH Counselor (Signed)
TTS attempted to assess patient, patient continues to be asleep unable to wake. Pt alcohol level 280
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
## 2018-05-16 NOTE — ED Notes (Signed)
Bed: QJJ94 Expected date:  Expected time:  Means of arrival:  Comments: Hold for hall D
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
## 2018-05-16 NOTE — ED Provider Notes (Signed)
[facility]-EMERGENCY DEPT Provider Note   CSN: [id number] Arrival date & time: 05/16/18  8325    History   Chief Complaint Chief Complaint  Patient presents with  . Alcohol Intoxication    HPI Troy Ponce is a 43 y.o. male.     HPI Patient brought in by police escort.  Has history of alcohol abuse and bipolar disorder.  States he is on no medications currently.  States he is having thoughts of killing himself daily.  No specific plan.  He also is having thoughts about harming police officers.  Making threats in the emergency department.  States he last drank alcohol this morning.  Has had alcohol withdrawal symptoms in the past but never had seizures.  Not currently hallucinating. Past Medical History:  Diagnosis Date  . Allergy   . Bipolar disorder (HCC)   . ETOH abuse   . ETOHism (HCC)   . Pneumonia   . Tobacco abuse     Patient Active Problem List   Diagnosis Date Noted  . Radiculopathy due to lumbar intervertebral disc disorder 08/04/2016  . Macrocytic anemia 09/30/2015  . HNP (herniated nucleus pulposus), lumbar 09/27/2015  . Sepsis (HCC) 09/27/2015  . Alcohol use 09/27/2015  . Cellulitis of right lower leg 09/27/2015  . AKI (acute kidney injury) (HCC) 09/27/2015  . Tobacco abuse   . Bipolar disorder (HCC)   . Alcohol intoxication (HCC) 08/23/2012    Past Surgical History:  Procedure Laterality Date  . APPENDECTOMY    . HERNIA REPAIR    . SHOULDER ARTHROSCOPY          Home Medications    Prior to Admission medications   Medication Sig Start Date End Date Taking? Authorizing Provider  albuterol (PROVENTIL HFA;VENTOLIN HFA) 108 (90 Base) MCG/ACT inhaler Inhale 1-2 puffs into the lungs every 6 (six) hours as needed for wheezing or shortness of breath. 05/11/18   Claude Manges, PA-C  benzonatate (TESSALON) 100 MG capsule Take 1 capsule (100 mg total) by mouth every 8 (eight) hours for 7 days. 05/11/18 05/18/18  Claude Manges, PA-C    cyclobenzaprine (FLEXERIL) 10 MG tablet Take 1 tablet (10 mg total) by mouth 3 (three) times daily as needed for muscle spasms. Patient not taking: Reported on 05/11/2018 06/11/16   Kirtland Bouchard, PA-C  doxycycline (VIBRAMYCIN) 100 MG capsule Take 1 capsule (100 mg total) by mouth 2 (two) times daily for 7 days. 05/11/18 05/18/18  Claude Manges, PA-C  HYDROcodone-acetaminophen (NORCO/VICODIN) 5-325 MG tablet Take 1 tablet by mouth every 6 (six) hours as needed for moderate pain. Patient not taking: Reported on 05/11/2018 07/23/16   Eldred Manges, MD  ibuprofen (ADVIL,MOTRIN) 600 MG tablet Take 1 tablet (600 mg total) by mouth every 6 (six) hours as needed. Patient not taking: Reported on 05/11/2018 05/04/16   Loren Racer, MD  oxyCODONE (ROXICODONE) 5 MG immediate release tablet Take 1 tablet (5 mg total) by mouth every 4 (four) hours as needed for severe pain. Patient not taking: Reported on 05/11/2018 02/23/18   Sabas Sous, MD  oxyCODONE-acetaminophen (PERCOCET/ROXICET) 5-325 MG tablet Take 1 tablet by mouth every 6 (six) hours as needed. Patient not taking: Reported on 05/11/2018 02/05/18   Eyvonne Mechanic, PA-C    Family History Family History  Problem Relation Age of Onset  . Hypertension Mother   . Hypertension Father   . Cancer Maternal Grandmother   . Heart disease Maternal Grandfather   . Cancer Paternal Grandmother   .  Diabetes Paternal Grandmother   . Cancer Paternal Grandfather     Social History Social History   Tobacco Use  . Smoking status: Current Every Day Smoker    Packs/day: 2.00    Years: 30.00    Pack years: 60.00    Types: Cigarettes    Start date: 03/11/1984  . Smokeless tobacco: Never Used  Substance Use Topics  . Alcohol use: Yes    Alcohol/week: 6.0 standard drinks    Types: 6 Cans of beer per week    Comment: former  . Drug use: No    Comment: adderall     Allergies   Other; Lisinopril; and Broccoli [brassica oleracea italica]   Review of  Systems Review of Systems  Constitutional: Negative for fever.  Eyes: Negative for visual disturbance.  Respiratory: Negative for cough and shortness of breath.   Cardiovascular: Negative for chest pain.  Gastrointestinal: Negative for abdominal pain, nausea and vomiting.  Musculoskeletal: Negative for back pain, neck pain and neck stiffness.  Skin: Negative for rash and wound.  Neurological: Negative for dizziness, weakness, light-headedness, numbness and headaches.  Psychiatric/Behavioral: Positive for agitation and suicidal ideas.  All other systems reviewed and are negative.    Physical Exam Updated Vital Signs BP 136/89   Pulse (!) 101   Temp 98.1 F (36.7 C) (Oral)   Resp 19   Ht  (1.778 m)   Wt 89 kg   SpO2 95%   BMI 28.15 kg/m   Physical Exam Vitals signs and nursing note reviewed.  Constitutional:      Appearance: He is well-developed.  HENT:     Head: Normocephalic and atraumatic.     Nose: Nose normal.     Mouth/Throat:     Mouth: Mucous membranes are moist.  Eyes:     Extraocular Movements: Extraocular movements intact.     Pupils: Pupils are equal, round, and reactive to light.  Neck:     Musculoskeletal: Normal range of motion and neck supple. No neck rigidity or muscular tenderness.  Cardiovascular:     Rate and Rhythm: Normal rate and regular rhythm.     Heart sounds: No murmur. No friction rub. No gallop.   Pulmonary:     Effort: Pulmonary effort is normal.     Breath sounds: Normal breath sounds.  Abdominal:     General: Bowel sounds are normal.     Palpations: Abdomen is soft.     Tenderness: There is no abdominal tenderness. There is no guarding or rebound.  Musculoskeletal: Normal range of motion.        General: No swelling, tenderness, deformity or signs of injury.     Right lower leg: No edema.     Left lower leg: No edema.  Lymphadenopathy:     Cervical: No cervical adenopathy.  Skin:    General: Skin is warm and dry.      Capillary Refill: Capillary refill takes less than 2 seconds.     Findings: No erythema or rash.  Neurological:     General: No focal deficit present.     Mental Status: He is alert and oriented to person, place, and time.     Comments: No tremor noted.  5/5 motor in all extremities.  Sensation intact.  Psychiatric:     Comments: Patient is agitated and making threatening remarks.  Endorses active homicidal and suicidal ideation.  Denies hallucinations.      ED Treatments / Results  Labs (all labs ordered are  listed, but only abnormal results are displayed) Labs Reviewed  COMPREHENSIVE METABOLIC PANEL - Abnormal; Notable for the following components:      Result Value   Glucose, Bld 105 (*)    Calcium 8.8 (*)    AST 234 (*)    ALT 200 (*)    All other components within normal limits  ETHANOL - Abnormal; Notable for the following components:   Alcohol, Ethyl (B) 288 (*)    All other components within normal limits  ACETAMINOPHEN LEVEL - Abnormal; Notable for the following components:   Acetaminophen (Tylenol), Serum <10 (*)    All other components within normal limits  CBC - Abnormal; Notable for the following components:   MCV 102.8 (*)    All other components within normal limits  RAPID URINE DRUG SCREEN, HOSP PERFORMED - Abnormal; Notable for the following components:   Benzodiazepines POSITIVE (*)    All other components within normal limits  SALICYLATE LEVEL    EKG None  Radiology No results found.  Procedures Procedures (including critical care time)  Medications Ordered in ED Medications  ziprasidone (GEODON) 20 MG injection (has no administration in time range)  LORazepam (ATIVAN) 2 MG/ML injection (  Not Given 05/16/18 1056)  sterile water (preservative free) injection (has no administration in time range)  LORazepam (ATIVAN) injection 0-4 mg ( Intravenous See Alternative 05/16/18 1057)    Or  LORazepam (ATIVAN) tablet 0-4 mg (0 mg Oral Not Given 05/16/18  1057)  LORazepam (ATIVAN) injection 0-4 mg (has no administration in time range)    Or  LORazepam (ATIVAN) tablet 0-4 mg (has no administration in time range)  thiamine (VITAMIN B-1) tablet 100 mg (has no administration in time range)    Or  thiamine (B-1) injection 100 mg (has no administration in time range)  nicotine (NICODERM CQ - dosed in mg/24 hours) patch 21 mg (has no administration in time range)  ziprasidone (GEODON) injection 10 mg (10 mg Intramuscular Given 05/16/18 1014)  LORazepam (ATIVAN) injection 2 mg (2 mg Intramuscular Given 05/16/18 1014)     Initial Impression / Assessment and Plan / ED Course  I have reviewed the triage vital signs and the nursing notes.  Pertinent labs & imaging results that were available during my care of the patient were reviewed by me and considered in my medical decision making (see chart for details).        Patient attempting to leave to smoke cigarettes.  Given intoxication and suicidal ideation IVC paperwork is filed.  Will start on CIWA protocol and give nicotine patch.  Mild elevation in LFTs likely related to alcohol abuse.  Will need to follow-up with gastroenterology once psychiatrically stable.  Patient is medically cleared for psychiatric evaluation. Final Clinical Impressions(s) / ED Diagnoses   Final diagnoses:  Alcoholic intoxication without complication (HCC)  Suicidal ideation  Elevated liver enzymes    ED Discharge Orders    None       Loren Racer, MD 05/16/18 [phone]

## 2018-05-16 NOTE — [facility] Counselor (Signed)
TTS writer went to assess patient. Patient is sleep and would not awake with verbal commend. TTS will attempt to assessment patient later this date.

## 2018-05-17 ENCOUNTER — Other Ambulatory Visit: Payer: Self-pay

## 2018-05-17 ENCOUNTER — Encounter (HOSPITAL_COMMUNITY): Payer: Self-pay

## 2018-05-17 ENCOUNTER — Emergency Department (HOSPITAL_COMMUNITY)
Admission: EM | Admit: 2018-05-17 | Discharge: 2018-05-17 | Disposition: A | Discharge status: Home / Self Care | Payer: Self-pay | Attending: Emergency Medicine | Admitting: Emergency Medicine

## 2018-05-17 DIAGNOSIS — F419 Anxiety disorder, unspecified: Secondary | ICD-10-CM | POA: Insufficient documentation

## 2018-05-17 DIAGNOSIS — Y906 Blood alcohol level of 120-199 mg/100 ml: Secondary | ICD-10-CM | POA: Insufficient documentation

## 2018-05-17 DIAGNOSIS — Z046 Encounter for general psychiatric examination, requested by authority: Secondary | ICD-10-CM | POA: Insufficient documentation

## 2018-05-17 DIAGNOSIS — F319 Bipolar disorder, unspecified: Secondary | ICD-10-CM | POA: Insufficient documentation

## 2018-05-17 DIAGNOSIS — F1010 Alcohol abuse, uncomplicated: Secondary | ICD-10-CM

## 2018-05-17 DIAGNOSIS — F1014 Alcohol abuse with alcohol-induced mood disorder: Secondary | ICD-10-CM | POA: Diagnosis present

## 2018-05-17 DIAGNOSIS — F10120 Alcohol abuse with intoxication, uncomplicated: Secondary | ICD-10-CM | POA: Insufficient documentation

## 2018-05-17 DIAGNOSIS — F17210 Nicotine dependence, cigarettes, uncomplicated: Secondary | ICD-10-CM | POA: Insufficient documentation

## 2018-05-17 LAB — BASIC METABOLIC PANEL
Anion gap: 13 (ref 5–15)
BUN: 12 mg/dL (ref 6–20)
CHLORIDE: 106 mmol/L (ref 98–111)
CO2: 23 mmol/L (ref 22–32)
CREATININE: 1.12 mg/dL (ref 0.61–1.24)
Calcium: 9.2 mg/dL (ref 8.9–10.3)
GFR calc Af Amer: >60 mL/min (ref >60)
GFR calc non Af Amer: >60 mL/min (ref >60)
Glucose, Bld: 130 mg/dL — ABNORMAL HIGH (ref 70–99)
Potassium: 3.7 mmol/L (ref 3.5–5.1)
Sodium: 142 mmol/L (ref 135–145)

## 2018-05-17 LAB — ETHANOL: Alcohol, Ethyl (B): 180 mg/dL — ABNORMAL HIGH (ref <10)

## 2018-05-17 MED ORDER — GABAPENTIN 300 MG PO CAPS: 300.0000 mg | ORAL_CAPSULE | Freq: Three times a day (TID) | ORAL | 0 refills | Status: AC

## 2018-05-17 MED ORDER — GABAPENTIN 300 MG PO CAPS: 300.0000 mg | ORAL_CAPSULE | Freq: Three times a day (TID) | ORAL | Status: DC

## 2018-05-17 NOTE — ED Triage Notes (Signed)
Pt states that he was d/c this morning. Pt states that it was a "bad idea".  Pt is unclear on SI. Pt states "he doesn't know how to explain it". "I don't know how I wake up sometimes in the morning",. Pt denies HI. Pt admits to ETOH today.

## 2018-05-17 NOTE — ED Notes (Signed)
Bed: [facility]4 Expected date:  Expected time:  Means of arrival:  Comments: 

## 2018-05-17 NOTE — ED Notes (Signed)
Prior to triaging pt, pt was eating stickers at registration.

## 2018-05-17 NOTE — Progress Notes (Signed)
Received Troy Ponce this PM at the change of shift, asleep in bed. He woke up, received fluids, medications and his CIWA score was 12. He was reassessed per order with a repeat CIWA score of 7. He continued to slepp throughout the interruptions without difficulty.

## 2018-05-17 NOTE — ED Notes (Signed)
Pt discharged safely after reviewing D/C instructions and RX.  Pt was in no distress.  All belongings were returned to pt.

## 2018-05-17 NOTE — BH Assessment (Signed)
[facility] Assessment Progress Note  Per Juanetta Beets, DO, this pt does not require psychiatric hospitalization at this time.  Pt presents under IVC initiated by EDP Loren Racer, MD, which Dr Sharma Covert has rescinded.  Pt is to be discharged from [facility] with recommendation to follow [facility] with [facility].  This has been included in pt's discharge instructions.  Pt would also benefit from seeing Peer Support Specialists; they will be asked to speak to pt.  Pt's nurse, Kendal Hymen, has been notified.  Doylene Canning, MA Triage Specialist [phone]

## 2018-05-17 NOTE — Discharge Instructions (Addendum)
Continue to avoid alcohol.  Follow-up with your psychiatrist, as planned and as needed.

## 2018-05-17 NOTE — ED Notes (Signed)
Pt is discharged, he is waiting for Peer support to help with placement.

## 2018-05-17 NOTE — ED Notes (Signed)
Pt admitted to room 39 which he left a few hours ago.  Pt is intoxicated and rude.  Pt refuses to put a shirt on or follow unit rules.

## 2018-05-17 NOTE — BH Assessment (Signed)
[facility] Assessment Progress Note Patient was discharged earlier this date (see Lord DNP note in Epic) and presents back to [facility] stating "he could not find a ride home" so he "had no where else to go." Patient denies any S/I, H/I or AVH. Patient reports he "had a couple beers" after he was discharged due to being frustrated with current situation. Patient is requesting to be discharged after making several phone calls and states "he has got it covered" in reference to transportation issues. Case was staffed with Shaune Pollack DNP who recommended patient be discharged and follow up with OP resources that were provided earlier this date. Jerelyn Charles MD was notified and agreed to discharge.

## 2018-05-17 NOTE — ED Provider Notes (Signed)
[facility]-EMERGENCY DEPT Provider Note   CSN: [id number] Arrival date & time: 05/17/18  1507    History   Chief Complaint Chief Complaint  Patient presents with  . Psychiatric Evaluation    HPI Troy Ponce is a 43 y.o. male.     HPI  Patient was here about 3 hours ago, and returned after "drinking a lot of alcohol."  After leaving , he states he had talked to his father and told him to go back home and that he did not need to go with him.  The patient states that he wants to "be put down with Thorazine and Geodon."  He states that his bipolar disorder causes him to lose control and that even "a bullet will not stop me."  The patient is focusing on a low enforcement officer standing hallway who is not currently interacting with the patient.  The patient denies suicidal ideation.  He denies specific homicidal ideation.  The patient told this examiner that "I am telling you what you need to do so that I do not lose it."  There are no other known modifying factors.  Past Medical History:  Diagnosis Date  . Allergy   . Bipolar disorder (HCC)   . ETOH abuse   . ETOHism (HCC)   . Pneumonia   . Tobacco abuse     Patient Active Problem List   Diagnosis Date Noted  . 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 Radiculopathy due to lumbar intervertebral disc disorder 08/04/2016  . Macrocytic anemia 09/30/2015  . HNP (herniated nucleus pulposus), lumbar 09/27/2015  . Sepsis (HCC) 09/27/2015  . Alcohol use 09/27/2015  . Cellulitis of right lower leg 09/27/2015  . AKI (acute kidney injury) (HCC) 09/27/2015  . Tobacco abuse   . Bipolar disorder (HCC)   . Alcohol intoxication (HCC) 08/23/2012    Past Surgical History:  Procedure Laterality Date  . APPENDECTOMY    . HERNIA REPAIR    . SHOULDER ARTHROSCOPY          Home Medications    Prior to Admission medications   Medication Sig Start Date End Date Taking? Authorizing  Provider  albuterol (PROVENTIL HFA;VENTOLIN HFA) 108 (90 Base) MCG/ACT inhaler Inhale 1-2 puffs into the lungs every 6 (six) hours as needed for wheezing or shortness of breath. 05/11/18   Claude MangesSoto, Johana, PA-C  gabapentin (NEURONTIN) 300 MG capsule Take 1 capsule (300 mg total) by mouth 3 (three) times daily. 05/17/18   Charm RingsLord, Jamison Y, NP    Family History Family History  Problem Relation Age of Onset  . Hypertension Mother   . Hypertension Father   . Cancer Maternal Grandmother   . Heart disease Maternal Grandfather   . Cancer Paternal Grandmother   . Diabetes Paternal Grandmother   . Cancer Paternal Grandfather     Social History Social History   Tobacco Use  . Smoking status: Current Every Day Smoker    Packs/day: 2.00    Years: 30.00    Pack years: 60.00    Types: Cigarettes    Start date: 03/11/1984  . Smokeless tobacco: Never Used  Substance Use Topics  . Alcohol use: Yes    Alcohol/week: 6.0 standard drinks    Types: 6 Cans of beer per week    Comment: former  . Drug use: No    Comment: adderall     Allergies   Lisinopril and Broccoli [brassica oleracea italica]   Review of Systems  Review of Systems  All other systems reviewed and are negative.    Physical Exam Updated Vital Signs BP 121/83 (BP Location: Left Arm)   Pulse (!) 107   Temp 98.4 F (36.9 C) (Oral)   Resp 19   Ht 5\' 10"  (1.778 m)   Wt 89 kg   SpO2 98%   BMI 28.15 kg/m   Physical Exam Vitals signs and nursing note reviewed.  Constitutional:      General: He is in acute distress.     Appearance: He is well-developed and normal weight. He is not ill-appearing, toxic-appearing or diaphoretic.  HENT:     Head: Normocephalic and atraumatic.     Right Ear: External ear normal.     Left Ear: External ear normal.  Eyes:     Conjunctiva/sclera: Conjunctivae normal.     Pupils: Pupils are equal, round, and reactive to light.  Neck:     Musculoskeletal: Normal range of motion and neck  supple.     Trachea: Phonation normal.  Cardiovascular:     Rate and Rhythm: Normal rate. Rhythm irregular.  Pulmonary:     Effort: Pulmonary effort is normal.  Musculoskeletal: Normal range of motion.  Skin:    General: Skin is warm and dry.  Neurological:     Mental Status: He is alert and oriented to person, place, and time.     Cranial Nerves: No cranial nerve deficit.     Sensory: No sensory deficit.     Motor: No abnormal muscle tone.     Coordination: Coordination normal.  Psychiatric:        Attention and Perception: He is attentive. He does not perceive auditory or visual hallucinations.        Mood and Affect: Mood is anxious. Affect is labile, angry and inappropriate.        Speech: He is communicative. Speech is not rapid and pressured, delayed, slurred or tangential.        Behavior: Behavior is agitated and aggressive. Behavior is not slowed, withdrawn or hyperactive.        Thought Content: Thought content is not paranoid. Thought content does not include homicidal or suicidal ideation.        Cognition and Memory: Cognition is impaired.        Judgment: Judgment is impulsive and inappropriate.      ED Treatments / Results  Labs (all labs ordered are listed, but only abnormal results are displayed) Labs Reviewed  ETHANOL  CBC WITH DIFFERENTIAL/PLATELET  BASIC METABOLIC PANEL    EKG None  Radiology No results found.  Procedures Procedures (including critical care time)  Medications Ordered in ED Medications - No data to display   Initial Impression / Assessment and Plan / ED Course  I have reviewed the triage vital signs and the nursing notes.  Pertinent labs & imaging results that were available during my care of the patient were reviewed by me and considered in my medical decision making (see chart for details).         Patient Vitals for the past 24 hrs:  BP Temp Temp src Pulse Resp SpO2 Height Weight  05/17/18 1532 121/83 98.4 F (36.9  C) Oral (!) 107 19 98 % - -  05/17/18 1530 - - - - - - 5\' 10"  (1.778 m) 89 kg    4:56 PM Reevaluation with update and discussion. After initial assessment and treatment, an updated evaluation reveals no change in clinical status.  He has  been seen by TTS who did not find a reason to keep him here.  Patient continues to deny suicidal ideation/plan or homicidal ideation.Mancel Bale   Medical Decision Making: Alcohol abuse, with reported behavior.  No active acute psychiatric abnormality.  CRITICAL CARE-no Performed by: Mancel Bale  Nursing Notes Reviewed/ Care Coordinated Applicable Imaging Reviewed Interpretation of Laboratory Data incorporated into ED treatment  The patient appears reasonably screened and/or stabilized for discharge and I doubt any other medical condition or other [facility] requiring further screening, evaluation, or treatment in the ED at this time prior to discharge.  Plan: Home Medications-usual; Home Treatments-avoid alcohol; return here if the recommended treatment, does not improve the symptoms; Recommended follow up-character,.    Final Clinical Impressions(s) / ED Diagnoses   Final diagnoses:  Alcohol abuse    ED Discharge Orders    None       Mancel Bale, MD 05/17/18 [phone]

## 2018-05-17 NOTE — Discharge Instructions (Signed)
To help you maintain a sober lifestyle, a substance abuse treatment program may be beneficial to you.  Contact [facility] at your earliest opportunity to ask about enrolling in their program: ° °     [facility] °     [address] °     [place], [place] [address] °     [phone] °     New patients are seen at the walk-in clinic every Tuesday from 9:00 am - 12:00 pm °

## 2018-05-17 NOTE — Patient Outreach (Signed)
ED Peer Support Specialist Patient Intake (Complete at intake & 30-60 Day Follow-up)  Name: Troy Ponce  MRN: [id number]  Age: 43 y.o.   Date of Admission: 05/17/2018  Intake: Initial Comments:      Primary Reason Admitted: Alcoholic intoxication without complication    Lab values: Alcohol/ETOH: Positive Positive UDS? No Amphetamines: No Barbiturates: No Benzodiazepines: Yes Cocaine: No Opiates: No Cannabinoids: No  Demographic information: Gender: Male Ethnicity: African American Marital Status: Divorced Insurance Status: Uninsured/Self-pay Control and instrumentation engineer (Work Engineer, agricultural, Sales executive, etc.:   Lives with: Partner/Spouse Living situation: House/Apartment  Reported Patient History: Patient reported health conditions: Bipolar disorder, Depression Patient aware of HIV and hepatitis status: No  In past year, has patient visited ED for any reason? No  Number of ED visits:    Reason(s) for visit:    In past year, has patient been hospitalized for any reason? No  Number of hospitalizations:    Reason(s) for hospitalization:    In past year, has patient been arrested? No  Number of arrests:    Reason(s) for arrest:    In past year, has patient been incarcerated? No  Number of incarcerations:    Reason(s) for incarceration:    In past year, has patient received medication-assisted treatment? No  In past year, patient received the following treatments:    In past year, has patient received any harm reduction services? No  Did this include any of the following?    In past year, has patient received care from a mental health provider for diagnosis other than SUD? No  In past year, is this first time patient has overdosed? No  Number of past overdoses:    In past year, is this first time patient has been hospitalized for an overdose? No  Number of hospitalizations for overdose(s):    Is patient currently receiving treatment  for a mental health diagnosis? No  Patient reports experiencing difficulty participating in SUD treatment: No    Most important reason(s) for this difficulty?    Has patient received prior services for treatment? No  In past, patient has received services from following agencies:    Plan of Care:  Suggested follow up at these agencies/treatment centers: (Wants community services that will allow him to work. )  Other information: [facility] prcoessed with Pt and talked with Pt to gain information to better assist Pt with services. [facility] was made aware that he had drank to much an that he does not want to loss his job. [facility] was able to gave Pt some resources to help him in the community. [facility] left contact information for Pt to follow up with [facility].     Arlys John Alexandera Kuntzman, C[facility]  05/17/2018 11:17 AM
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
## 2018-05-17 NOTE — Patient Outreach (Signed)
Pt returned to the Hospital after drinking a few drinks after earlier discharge. [facility] faxed Pt information to RTS services and Pt is completing a phone assessment.

## 2018-07-31 ENCOUNTER — Other Ambulatory Visit: Payer: Self-pay

## 2018-07-31 ENCOUNTER — Encounter (HOSPITAL_COMMUNITY): Payer: Self-pay | Admitting: Emergency Medicine

## 2018-07-31 ENCOUNTER — Emergency Department (HOSPITAL_COMMUNITY)
Admission: EM | Admit: 2018-07-31 | Discharge: 2018-07-31 | Disposition: A | Discharge status: Home / Self Care | Payer: Self-pay | Attending: Emergency Medicine | Admitting: Emergency Medicine

## 2018-07-31 DIAGNOSIS — R45851 Suicidal ideations: Secondary | ICD-10-CM | POA: Insufficient documentation

## 2018-07-31 DIAGNOSIS — Z1159 Encounter for screening for other viral diseases: Secondary | ICD-10-CM | POA: Insufficient documentation

## 2018-07-31 DIAGNOSIS — F1010 Alcohol abuse, uncomplicated: Secondary | ICD-10-CM

## 2018-07-31 DIAGNOSIS — F319 Bipolar disorder, unspecified: Secondary | ICD-10-CM | POA: Insufficient documentation

## 2018-07-31 DIAGNOSIS — F17210 Nicotine dependence, cigarettes, uncomplicated: Secondary | ICD-10-CM | POA: Insufficient documentation

## 2018-07-31 DIAGNOSIS — F4321 Adjustment disorder with depressed mood: Secondary | ICD-10-CM | POA: Insufficient documentation

## 2018-07-31 DIAGNOSIS — Y908 Blood alcohol level of 240 mg/100 ml or more: Secondary | ICD-10-CM | POA: Insufficient documentation

## 2018-07-31 DIAGNOSIS — F10920 Alcohol use, unspecified with intoxication, uncomplicated: Secondary | ICD-10-CM | POA: Insufficient documentation

## 2018-07-31 LAB — CBC WITH DIFFERENTIAL/PLATELET
Abs Immature Granulocytes: 0.05 10*3/uL (ref 0.00–0.07)
Basophils Absolute: 0.1 10*3/uL (ref 0.0–0.1)
Basophils Relative: 1 %
Eosinophils Absolute: 0.1 10*3/uL (ref 0.0–0.5)
Eosinophils Relative: 1 %
HCT: 48.9 % (ref 39.0–52.0)
Hemoglobin: 16.4 g/dL (ref 13.0–17.0)
Immature Granulocytes: 1 %
Lymphocytes Relative: 41 %
Lymphs Abs: 3.6 10*3/uL (ref 0.7–4.0)
MCH: 32.5 pg (ref 26.0–34.0)
MCHC: 33.5 g/dL (ref 30.0–36.0)
MCV: 97 fL (ref 80.0–100.0)
Monocytes Absolute: 0.8 10*3/uL (ref 0.1–1.0)
Monocytes Relative: 9 %
Neutro Abs: 4.2 10*3/uL (ref 1.7–7.7)
Neutrophils Relative %: 47 %
Platelets: 276 10*3/uL (ref 150–400)
RBC: 5.04 MIL/uL (ref 4.22–5.81)
RDW: 13.6 % (ref 11.5–15.5)
WBC: 8.7 10*3/uL (ref 4.0–10.5)
nRBC: 0 % (ref 0.0–0.2)

## 2018-07-31 LAB — COMPREHENSIVE METABOLIC PANEL
ALT: 41 U/L (ref 0–44)
AST: 37 U/L (ref 15–41)
Albumin: 4 g/dL (ref 3.5–5.0)
Alkaline Phosphatase: 51 U/L (ref 38–126)
Anion gap: 11 (ref 5–15)
BUN: 10 mg/dL (ref 6–20)
CO2: 29 mmol/L (ref 22–32)
Calcium: 8.9 mg/dL (ref 8.9–10.3)
Chloride: 101 mmol/L (ref 98–111)
Creatinine, Ser: 0.8 mg/dL (ref 0.61–1.24)
GFR calc Af Amer: >60 mL/min (ref >60)
GFR calc non Af Amer: >60 mL/min (ref >60)
Glucose, Bld: 162 mg/dL — ABNORMAL HIGH (ref 70–99)
Potassium: 3.6 mmol/L (ref 3.5–5.1)
Sodium: 141 mmol/L (ref 135–145)
Total Bilirubin: 0.5 mg/dL (ref 0.3–1.2)
Total Protein: 7.1 g/dL (ref 6.5–8.1)

## 2018-07-31 LAB — RAPID URINE DRUG SCREEN, HOSP PERFORMED
Amphetamines: NOT DETECTED
Barbiturates: NOT DETECTED
Benzodiazepines: NOT DETECTED
Cocaine: NOT DETECTED
Opiates: NOT DETECTED
Tetrahydrocannabinol: NOT DETECTED

## 2018-07-31 LAB — ACETAMINOPHEN LEVEL: Acetaminophen (Tylenol), Serum: <10 ug/mL — ABNORMAL LOW (ref 10–30)

## 2018-07-31 LAB — ETHANOL: Alcohol, Ethyl (B): 344 mg/dL (ref <10)

## 2018-07-31 LAB — SARS CORONAVIRUS 2 BY RT PCR (HOSPITAL ORDER, PERFORMED IN ~~LOC~~ HOSPITAL LAB): SARS Coronavirus 2: NEGATIVE

## 2018-07-31 LAB — SALICYLATE LEVEL: Salicylate Lvl: <7 mg/dL (ref 2.8–30.0)

## 2018-07-31 MED ORDER — LORAZEPAM 1 MG PO TABS: 0.0000 mg | ORAL_TABLET | Freq: Two times a day (BID) | ORAL | Status: DC

## 2018-07-31 MED ORDER — LORAZEPAM 2 MG/ML IJ SOLN: 0.0000 mg | Freq: Four times a day (QID) | INTRAMUSCULAR | Status: DC

## 2018-07-31 MED ORDER — ALUM & MAG HYDROXIDE-SIMETH 200-200-20 MG/5ML PO SUSP: 30.0000 mL | Freq: Four times a day (QID) | ORAL | Status: DC | PRN

## 2018-07-31 MED ORDER — HALOPERIDOL LACTATE 5 MG/ML IJ SOLN
5.0000 mg | Freq: Once | INTRAMUSCULAR | Status: AC
  Administered 2018-07-31: 5 mg via INTRAMUSCULAR
  Filled 2018-07-31: qty 1

## 2018-07-31 MED ORDER — VITAMIN B-1 100 MG PO TABS
100.0000 mg | ORAL_TABLET | Freq: Every day | ORAL | Status: DC
  Administered 2018-07-31: 100 mg via ORAL
  Filled 2018-07-31: qty 1

## 2018-07-31 MED ORDER — ONDANSETRON HCL 4 MG PO TABS: 4.0000 mg | ORAL_TABLET | Freq: Three times a day (TID) | ORAL | Status: DC | PRN

## 2018-07-31 MED ORDER — NICOTINE 21 MG/24HR TD PT24
21.0000 mg | MEDICATED_PATCH | Freq: Every day | TRANSDERMAL | Status: DC
  Administered 2018-07-31: 15:00:00 21 mg via TRANSDERMAL
  Filled 2018-07-31: qty 1

## 2018-07-31 MED ORDER — LORAZEPAM 2 MG/ML IJ SOLN: 0.0000 mg | Freq: Two times a day (BID) | INTRAMUSCULAR | Status: DC

## 2018-07-31 MED ORDER — THIAMINE HCL 100 MG/ML IJ SOLN: 100.0000 mg | Freq: Every day | INTRAMUSCULAR | Status: DC

## 2018-07-31 MED ORDER — LORAZEPAM 1 MG PO TABS
0.0000 mg | ORAL_TABLET | Freq: Four times a day (QID) | ORAL | Status: DC
  Filled 2018-07-31: qty 1

## 2018-07-31 MED ORDER — IBUPROFEN 400 MG PO TABS: 600.0000 mg | ORAL_TABLET | Freq: Three times a day (TID) | ORAL | Status: DC | PRN

## 2018-07-31 NOTE — BH Assessment (Signed)
[facility] Assessment Progress Note  Case was staffed with Arville Care FNP who recommended patient be discharged this date. This Clinical research associate staffed case with Luevenia Maxin PA who agreed to discharge.

## 2018-07-31 NOTE — BH Assessment (Signed)
Assessment Note  Troy Ponce is an 43 y.o. male that presents this date impaired with a BAL of 344 on admission. Patient denies any S/I, H/I or AVH at the time of assessment. Patient per notes patient voiced S/I on admission with a plan "to blow his brains" although denies having any access to a firearm at the time of assessment. Patient denies any prior mental health history and reports daily alcohol use for the last year stating he consumes 4 to 12 beers daily with last use on 07/30/18 when he reported he consumed 6 twelve once beers. Patient denies any other SA use with UDS negative this date. Patient denies any prior attempts or gestures at self harm. Per notes, patient has a history of alcohol abuse and tobacco abuse, that presents impaired voicing S/I. Patient at the time of assessment states he does not recall the events that transpired earlier this date. He reports that his mother died last week which has been a significant stressor to him. He endorses plan to shoot himself and states "I have a gun in my car". I swear to God if you do not help me, then it is all over. Patient states at the time of assessment that he does not recall making that statement and is requesting to be discharged this date. Patient was offered resources to assist with SA issues although declined. Patient contracts for safety. Case was staffed with Arville Care FNP who recommended patient be discharged this date. This Clinical research associate staffed case with Luevenia Maxin PA who agreed to discharge.      Diagnosis: Substance induced mood disorder   Past Medical History:  Past Medical History:  Diagnosis Date  . Allergy   . Bipolar disorder ([facility])   . ETOH abuse   . ETOHism ([facility])   . Pneumonia   . Tobacco abuse     Past Surgical History:  Procedure Laterality Date  . APPENDECTOMY    . HERNIA REPAIR    . SHOULDER ARTHROSCOPY      Family History:  Family History  Problem Relation Age of Onset  . Hypertension Mother   . Hypertension  Father   . Cancer Maternal Grandmother   . Heart disease Maternal Grandfather   . Cancer Paternal Grandmother   . Diabetes Paternal Grandmother   . Cancer Paternal Grandfather     Social History:  reports that he has been smoking cigarettes. He started smoking about 34 years ago. He has a 60.00 pack-year smoking history. He has never used smokeless tobacco. He reports current alcohol use of about 6.0 standard drinks of alcohol per week. He reports that he does not use drugs.  Additional Social History:  Alcohol / Drug Use Pain Medications: see MaR Prescriptions: see MAR Over the Counter: see MAR  History of alcohol / drug use?: Yes Longest period of sobriety (when/how long): Unknown Negative Consequences of Use: (Denies) Withdrawal Symptoms: (Denies currently) Substance #1 Name of Substance 1: Alcohol 1 - Age of First Use: 19 1 - Amount (size/oz): Varies 1 - Frequency: Varies 1 - Duration: Ongoing 1 - Last Use / Amount: 07/31/18 pt states "alot of alcohol" BAL was 344 on arrival  CIWA: CIWA-Ar BP: (!) 113/55 Pulse Rate: (!) 104 Nausea and Vomiting: no nausea and no vomiting Tactile Disturbances: none Tremor: no tremor Auditory Disturbances: not present Paroxysmal Sweats: no sweat visible Visual Disturbances: not present Anxiety: mildly anxious Headache, Fullness in Head: mild Agitation: normal activity Orientation and Clouding of Sensorium: oriented and can do  serial additions CIWA-Ar Total: 3 COWS:    Allergies:  Allergies  Allergen Reactions  . Hydrochlorothiazide Anaphylaxis  . Lisinopril Anaphylaxis  . Broccoli [Brassica Oleracea Italica] Other (See Comments)    "stomach feels like razor blades" if eaten     Home Medications: (Not in a hospital admission)   OB/GYN Status:  No LMP for male patient.  General Assessment Data Assessment unable to be completed: Yes Reason for not completing assessment: Pt was unwilling to complete the assessment at this time.   Pt said "Fuck you and your assessmet I'm not telling you noting.  I just want to die."  TTS will complete the assessment at a later time. [facility] Assessment: In system Is this a Tele or Face-to-Face Assessment?: Face-to-Face Is this an Initial Assessment or a Re-assessment for this encounter?: Initial Assessment Patient Accompanied by:: (NA) Language Other than English: No Living Arrangements: Other (Comment)(Alone) What gender do you identify as?: Male Marital status: Single Maiden name: NA Pregnancy Status: No Living Arrangements: Alone Can pt return to current living arrangement?: Yes Admission Status: Voluntary Is patient capable of signing voluntary admission?: Yes Referral Source: Self/Family/Friend Insurance type: Self pay  Medical Screening Exam [facility] Walk-in ONLY) Medical Exam completed: Yes  Crisis Care Plan Living Arrangements: Alone Legal Guardian: (NA) Name of Psychiatrist: None Name of Therapist: None  Education Status Is patient currently in school?: No Is the patient employed, unemployed or receiving disability?: Unemployed  Risk to self with the past 6 months Suicidal Ideation: No Has patient been a risk to self within the past 6 months prior to admission? : No Suicidal Intent: No Has patient had any suicidal intent within the past 6 months prior to admission? : No Is patient at risk for suicide?: No, but patient needs Medical Clearance Suicidal Plan?: No Has patient had any suicidal plan within the past 6 months prior to admission? : No Access to Means: No What has been your use of drugs/alcohol within the last 12 months?: NA Previous Attempts/Gestures: No How many times?: 0 Other Self Harm Risks: (Excessive alcohol use) Triggers for Past Attempts: (NA) Intentional Self Injurious Behavior: None Family Suicide History: No Recent stressful life event(s): Other (Comment)(Recent death) Persecutory voices/beliefs?: No Depression:  No Depression Symptoms: (NA) Substance abuse history and/or treatment for substance abuse?: No Suicide prevention information given to non-admitted patients: Not applicable  Risk to Others within the past 6 months Homicidal Ideation: No Does patient have any lifetime risk of violence toward others beyond the six months prior to admission? : No Thoughts of Harm to Others: No Current Homicidal Intent: No Current Homicidal Plan: No Access to Homicidal Means: No Identified Victim: NA History of harm to others?: No Assessment of Violence: None Noted Violent Behavior Description: NA Does patient have access to weapons?: No Criminal Charges Pending?: No Does patient have a court date: No Is patient on probation?: No  Psychosis Hallucinations: None noted Delusions: None noted  Mental Status Report Appearance/Hygiene: Unremarkable Eye Contact: Fair Motor Activity: Freedom of movement Speech: Logical/coherent Level of Consciousness: Quiet/awake Mood: Pleasant Affect: Appropriate to circumstance Anxiety Level: Minimal Thought Processes: Coherent, Relevant Judgement: Partial Orientation: Person, Place, Time Obsessive Compulsive Thoughts/Behaviors: None  Cognitive Functioning Concentration: Normal Memory: Recent Intact, Remote Intact Is patient IDD: No Insight: Fair Impulse Control: Fair Appetite: Good Have you had any weight changes? : No Change Sleep: No Change Total Hours of Sleep: 7 Vegetative Symptoms: None  ADLScreening [facility] Assessment Services) Patient's  cognitive ability adequate to safely complete daily activities?: Yes Patient able to express need for assistance with ADLs?: Yes Independently performs ADLs?: Yes (appropriate for developmental age)  Prior Inpatient Therapy Prior Inpatient Therapy: Yes Prior Therapy Dates: 2018 Prior Therapy Facilty/Provider(s): [facility] Reason for Treatment: SA issues  Prior Outpatient Therapy Prior Outpatient Therapy: No Does  patient have an ACCT team?: No Does patient have Intensive In-House Services?  : No Does patient have [facility] services? : No Does patient have P4CC services?: No  ADL Screening (condition at time of admission) Patient's cognitive ability adequate to safely complete daily activities?: Yes Is the patient deaf or have difficulty hearing?: No Does the patient have difficulty seeing, even when wearing glasses/contacts?: No Does the patient have difficulty concentrating, remembering, or making decisions?: No Patient able to express need for assistance with ADLs?: Yes Does the patient have difficulty dressing or bathing?: No Independently performs ADLs?: Yes (appropriate for developmental age) Does the patient have difficulty walking or climbing stairs?: No Weakness of Legs: None Weakness of Arms/Hands: None  Home Assistive Devices/Equipment Home Assistive Devices/Equipment: None  Therapy Consults (therapy consults require a physician order) PT Evaluation Needed: No OT Evalulation Needed: No SLP Evaluation Needed: No Abuse/Neglect Assessment (Assessment to be complete while patient is alone) Physical Abuse: Denies Verbal Abuse: Denies Sexual Abuse: Denies Exploitation of patient/patient's resources: Denies Self-Neglect: Denies Values / Beliefs Cultural Requests During Hospitalization: None Spiritual Requests During Hospitalization: None Consults Spiritual Care Consult Needed: No Social Work Consult Needed: No Merchant navy officer (For Healthcare) Does Patient Have a Medical Advance Directive?: No Would patient like information on creating a medical advance directive?: No - Patient declined          Disposition: Case was staffed with Arville Care FNP who recommended patient be discharged this date. This Clinical research associate staffed case with Luevenia Maxin PA who agreed to discharge.     Disposition Initial Assessment Completed for this Encounter: Yes Disposition of Patient: Discharge Patient refused  recommended treatment: No Mode of transportation if patient is discharged/movement?: Loreli Slot)  On Site Evaluation by:   Reviewed with Physician:    Alfredia Ferguson 07/31/2018 4:28 PM

## 2018-07-31 NOTE — Discharge Instructions (Signed)
Return to ER for new symptoms or additional concerns.

## 2018-07-31 NOTE — ED Notes (Signed)
Dinner tray ordered.

## 2018-07-31 NOTE — ED Notes (Signed)
Pt is currently calm, cooperative, and resting while watching television. Sitter is at pt bedside.

## 2018-07-31 NOTE — ED Notes (Signed)
Pt given [place] sandwich and Sprite per ED provider.

## 2018-07-31 NOTE — Progress Notes (Signed)
   07/31/18 0900  General Assessment Data  Reason for not completing assessment Pt was unwilling to complete the assessment at this time.  Pt said "Fuck you and your assessment I'm not telling you nothing.  I just want to die."  TTS will complete the assessment at a later time.    Omarius Grantham L. Maddalena Linarez, MS, [facility], [facility] Therapeutic Triage Specialist  [phone]

## 2018-07-31 NOTE — ED Provider Notes (Signed)
[facility] EMERGENCY DEPARTMENT Provider Note   CSN: [id number] Arrival date & time: 07/31/18  0911    History   Chief Complaint Chief Complaint  Patient presents with  . Suicidal    HPI Troy Ponce is a 43 y.o. male with history of alcohol abuse, tobacco abuse, bipolar disorder presents for evaluation of progressively worsening suicidal ideations.  He reports that his mother died last week which has been a significant stressor to him.  He endorses plan to shoot himself and states "I have a gun in my car.  I swear to God if you do not help me, then it is all over.  No one can stop me if I leave here ".  He denies homicidal ideation.  Does not think that he has been experiencing any auditory or visual hallucinations.  Endorses drinking 14-20 beers daily.  Appears intoxicated presently.    The history is provided by the patient.    Past Medical History:  Diagnosis Date  . Allergy   . Bipolar disorder (HCC)   . ETOH abuse   . ETOHism (HCC)   . Pneumonia   . Tobacco abuse     Patient Active Problem List   Diagnosis Date Noted  . 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 Radiculopathy due to lumbar intervertebral disc disorder 08/04/2016  . Macrocytic anemia 09/30/2015  . HNP (herniated nucleus pulposus), lumbar 09/27/2015  . Sepsis (HCC) 09/27/2015  . Alcohol use 09/27/2015  . Cellulitis of right lower leg 09/27/2015  . AKI (acute kidney injury) (HCC) 09/27/2015  . Tobacco abuse   . Bipolar disorder (HCC)   . Alcohol intoxication (HCC) 08/23/2012    Past Surgical History:  Procedure Laterality Date  . APPENDECTOMY    . HERNIA REPAIR    . SHOULDER ARTHROSCOPY          Home Medications    Prior to Admission medications   Medication Sig Start Date End Date Taking? Authorizing Provider  albuterol (PROVENTIL HFA;VENTOLIN HFA) 108 (90 Base) MCG/ACT inhaler Inhale 1-2 puffs into the lungs every 6 (six) hours as  needed for wheezing or shortness of breath. 05/11/18  Yes Soto, Leonie Douglas, PA-C  gabapentin (NEURONTIN) 300 MG capsule Take 1 capsule (300 mg total) by mouth 3 (three) times daily. Patient taking differently: Take 300 mg by mouth 2 (two) times daily.  05/17/18  Yes Charm Rings, NP    Family History Family History  Problem Relation Age of Onset  . Hypertension Mother   . Hypertension Father   . Cancer Maternal Grandmother   . Heart disease Maternal Grandfather   . Cancer Paternal Grandmother   . Diabetes Paternal Grandmother   . Cancer Paternal Grandfather     Social History Social History   Tobacco Use  . Smoking status: Current Every Day Smoker    Packs/day: 2.00    Years: 30.00    Pack years: 60.00    Types: Cigarettes    Start date: 03/11/1984  . Smokeless tobacco: Never Used  Substance Use Topics  . Alcohol use: Yes    Alcohol/week: 6.0 standard drinks    Types: 6 Cans of beer per week  . Drug use: No    Comment: adderall     Allergies   Hydrochlorothiazide; Lisinopril; and Broccoli [brassica oleracea italica]   Review of Systems Review of Systems  Constitutional: Negative for chills and fever.  Respiratory: Negative for shortness of breath.   Cardiovascular: Negative for  chest pain.  Gastrointestinal: Negative for abdominal pain, nausea and vomiting.  Psychiatric/Behavioral: Positive for dysphoric mood and suicidal ideas. Negative for hallucinations.  All other systems reviewed and are negative.    Physical Exam Updated Vital Signs BP (!) 161/95   Pulse 92   Resp 20   Ht 5\' 11"  (1.803 m)   SpO2 99%   BMI 27.37 kg/m   Physical Exam Vitals signs and nursing note reviewed.  Constitutional:      General: He is not in acute distress.    Appearance: He is well-developed.     Comments: Resting in bed, dressed in purple scrubs.  Smells of alcohol.  Fingernails are dirty  HENT:     Head: Normocephalic and atraumatic.  Eyes:     General:        Right  eye: No discharge.        Left eye: No discharge.     Conjunctiva/sclera: Conjunctivae normal.  Neck:     Musculoskeletal: Normal range of motion.     Vascular: No JVD.     Trachea: No tracheal deviation.  Cardiovascular:     Rate and Rhythm: Normal rate and regular rhythm.     Heart sounds: Normal heart sounds.  Pulmonary:     Effort: Pulmonary effort is normal.     Breath sounds: Normal breath sounds.  Abdominal:     General: Abdomen is protuberant. Bowel sounds are normal. There is no distension.     Palpations: Abdomen is soft.     Tenderness: There is no abdominal tenderness. There is no guarding or rebound.  Skin:    General: Skin is warm and dry.     Findings: No erythema.  Neurological:     Mental Status: He is alert.     Comments: Dysarthric speech secondary to alcohol intoxication.  Cranial nerves II through XII grossly intact.  Moves extremities spontaneously with intact strength.  No facial droop.  Follows commands without difficulty.  Psychiatric:        Mood and Affect: Mood is depressed. Affect is angry and tearful.        Speech: Speech is slurred.        Behavior: Behavior is cooperative.        Thought Content: Thought content includes suicidal ideation. Thought content does not include homicidal ideation. Thought content includes suicidal plan. Thought content does not include homicidal plan.     Comments: Intermittently tearful, fixating on police officer standing outside door.      ED Treatments / Results  Labs (all labs ordered are listed, but only abnormal results are displayed) Labs Reviewed  COMPREHENSIVE METABOLIC PANEL - Abnormal; Notable for the following components:      Result Value   Glucose, Bld 162 (*)    All other components within normal limits  ETHANOL - Abnormal; Notable for the following components:   Alcohol, Ethyl (B) 344 (*)    All other components within normal limits  ACETAMINOPHEN LEVEL - Abnormal; Notable for the following  components:   Acetaminophen (Tylenol), Serum <10 (*)    All other components within normal limits  SARS CORONAVIRUS 2 (HOSPITAL ORDER, PERFORMED IN [facility])  RAPID URINE DRUG SCREEN, HOSP PERFORMED  CBC WITH DIFFERENTIAL/PLATELET  SALICYLATE LEVEL    EKG None  Radiology No results found.  Procedures Procedures (including critical care time)  Medications Ordered in ED Medications  LORazepam (ATIVAN) injection 0-4 mg (has no administration in time range)  Or  LORazepam (ATIVAN) tablet 0-4 mg (has no administration in time range)  LORazepam (ATIVAN) injection 0-4 mg (has no administration in time range)    Or  LORazepam (ATIVAN) tablet 0-4 mg (has no administration in time range)  thiamine (VITAMIN B-1) tablet 100 mg (has no administration in time range)    Or  thiamine (B-1) injection 100 mg (has no administration in time range)  ibuprofen (ADVIL) tablet 600 mg (has no administration in time range)  nicotine (NICODERM CQ - dosed in mg/24 hours) patch 21 mg (has no administration in time range)  ondansetron (ZOFRAN) tablet 4 mg (has no administration in time range)  alum & mag hydroxide-simeth (MAALOX/MYLANTA) 200-200-20 MG/5ML suspension 30 mL (has no administration in time range)  haloperidol lactate (HALDOL) injection 5 mg (5 mg Intramuscular Given 07/31/18 1004)     Initial Impression / Assessment and Plan / ED Course  I have reviewed the triage vital signs and the nursing notes.  Pertinent labs & imaging results that were available during my care of the patient were reviewed by me and considered in my medical decision making (see chart for details).        Patient presenting for evaluation of suicidal ideations with various plans verbalized including shooting himself with a gun, cutting his wrists, and jumping into traffic.  He is also intoxicated.  His vital signs are stable and physical examination is reassuring.  Screening labs reviewed by me show  elevated ethanol level of 344, otherwise reassuring.  He is medically cleared for TTS evaluation upon sobering up.  CIWA protocol initiated.  Of note, patient is here voluntarily and will require IVC if he attempts to leave prior to psychiatric assessment.  Final Clinical Impressions(s) / ED Diagnoses   Final diagnoses:  Suicidal ideations  Alcohol abuse    ED Discharge Orders    None       Jeanie Sewer, PA-C 07/31/18 1447    Alvira Monday, MD 08/02/18 1008

## 2018-07-31 NOTE — ED Triage Notes (Signed)
Pt here with c/o suicidal states that he wants to shoot himself in the head \, states that he has been drinking and living in his car

## 2019-06-15 ENCOUNTER — Other Ambulatory Visit: Payer: Self-pay

## 2019-06-15 ENCOUNTER — Emergency Department (HOSPITAL_COMMUNITY)
Admission: EM | Admit: 2019-06-15 | Discharge: 2019-06-15 | Disposition: A | Discharge status: Home / Self Care | Payer: Self-pay | Attending: Emergency Medicine | Admitting: Emergency Medicine

## 2019-06-15 DIAGNOSIS — G8929 Other chronic pain: Secondary | ICD-10-CM | POA: Insufficient documentation

## 2019-06-15 DIAGNOSIS — M549 Dorsalgia, unspecified: Secondary | ICD-10-CM | POA: Insufficient documentation

## 2019-06-15 DIAGNOSIS — Z5321 Procedure and treatment not carried out due to patient leaving prior to being seen by health care provider: Secondary | ICD-10-CM | POA: Insufficient documentation

## 2019-06-15 NOTE — ED Triage Notes (Signed)
Pt reports chronic back pain, pt states heard something "pop at work one week ago". Pain has been worse since. Pt has large ice back on back, states he drank 5 beers PTA with "no relief in pain".

## 2019-06-15 NOTE — ED Notes (Signed)
Patient was called for but did that respond

## 2019-06-15 NOTE — ED Notes (Signed)
Pt called X2 more times with no response

## 2020-04-23 ENCOUNTER — Emergency Department (HOSPITAL_COMMUNITY): Payer: Self-pay

## 2020-04-23 ENCOUNTER — Encounter (HOSPITAL_COMMUNITY): Payer: Self-pay | Admitting: Emergency Medicine

## 2020-04-23 ENCOUNTER — Emergency Department (HOSPITAL_COMMUNITY)
Admission: EM | Admit: 2020-04-23 | Discharge: 2020-04-23 | Disposition: A | Discharge status: Home / Self Care | Payer: Self-pay | Attending: Emergency Medicine | Admitting: Emergency Medicine

## 2020-04-23 ENCOUNTER — Other Ambulatory Visit: Payer: Self-pay

## 2020-04-23 DIAGNOSIS — R042 Hemoptysis: Secondary | ICD-10-CM | POA: Insufficient documentation

## 2020-04-23 DIAGNOSIS — F17210 Nicotine dependence, cigarettes, uncomplicated: Secondary | ICD-10-CM | POA: Insufficient documentation

## 2020-04-23 DIAGNOSIS — Z716 Tobacco abuse counseling: Secondary | ICD-10-CM | POA: Insufficient documentation

## 2020-04-23 LAB — BASIC METABOLIC PANEL
Anion gap: 11 (ref 5–15)
BUN: 5 mg/dL — ABNORMAL LOW (ref 6–20)
CO2: 23 mmol/L (ref 22–32)
Calcium: 8.9 mg/dL (ref 8.9–10.3)
Chloride: 100 mmol/L (ref 98–111)
Creatinine, Ser: 0.6 mg/dL — ABNORMAL LOW (ref 0.61–1.24)
GFR, Estimated: >60 mL/min (ref >60)
Glucose, Bld: 115 mg/dL — ABNORMAL HIGH (ref 70–99)
Potassium: 3.8 mmol/L (ref 3.5–5.1)
Sodium: 134 mmol/L — ABNORMAL LOW (ref 135–145)

## 2020-04-23 LAB — CBC
HCT: 46.2 % (ref 39.0–52.0)
Hemoglobin: 15.9 g/dL (ref 13.0–17.0)
MCH: 34.3 pg — ABNORMAL HIGH (ref 26.0–34.0)
MCHC: 34.4 g/dL (ref 30.0–36.0)
MCV: 99.6 fL (ref 80.0–100.0)
Platelets: 128 10*3/uL — ABNORMAL LOW (ref 150–400)
RBC: 4.64 MIL/uL (ref 4.22–5.81)
RDW: 14 % (ref 11.5–15.5)
WBC: 7.9 10*3/uL (ref 4.0–10.5)
nRBC: 0 % (ref 0.0–0.2)

## 2020-04-23 LAB — TROPONIN I (HIGH SENSITIVITY)
Troponin I (High Sensitivity): 7 ng/L (ref <18)
Troponin I (High Sensitivity): 6 ng/L (ref <18)

## 2020-04-23 LAB — BRAIN NATRIURETIC PEPTIDE: B Natriuretic Peptide: 15.5 pg/mL (ref 0.0–100.0)

## 2020-04-23 MED ORDER — IOHEXOL 350 MG/ML SOLN
60.0000 mL | Freq: Once | INTRAVENOUS | Status: AC | PRN
  Administered 2020-04-23: 60 mL via INTRAVENOUS

## 2020-04-23 MED ORDER — BENZONATATE 100 MG PO CAPS: 100.0000 mg | ORAL_CAPSULE | Freq: Three times a day (TID) | ORAL | 0 refills | Status: AC

## 2020-04-23 MED ORDER — SODIUM CHLORIDE 0.9 % IV BOLUS
1000.0000 mL | Freq: Once | INTRAVENOUS | Status: AC
  Administered 2020-04-23: 1000 mL via INTRAVENOUS

## 2020-04-23 NOTE — ED Notes (Signed)
Reviewed discharge instructions with patient. Follow-up care and medications reviewed. Patient  verbalized understanding. Patient A&Ox4, VSS, and ambulatory with steady gait upon discharge.  °

## 2020-04-23 NOTE — ED Provider Notes (Signed)
[facility] EMERGENCY DEPARTMENT Provider Note   CSN: [id number] Arrival date & time: 04/23/20  1236     History Chief Complaint  Patient presents with  . Chest Pain  . Shortness of Breath    Troy Ponce is a 45 y.o. male who presents to the ED today with complaint of hemoptysis for the past week. Pt reports that he received his 2nd COVID vaccine in November and since then he has been coughing up "white phlegm." He states that last week it turned to blood. He states he is coughing up approximately 1/2 teaspoon per hour now. Pt also complains of chest pain/lung pain however states his lungs always hurt because he smokes 2 ppd. Pt states he has not felt well in "years" and doesn't know what it feels like to feel good anymore. No SI, HI, or AVH. Pt denies hx DVT/PE. No recent prolonged travel or immobilization. No exogenous hormone use. No active malignancy. Denies recent sick contacts. No other complaints at this time.   The history is provided by the patient and medical records.       Past Medical History:  Diagnosis Date  . Allergy   . Bipolar disorder (HCC)   . ETOH abuse   . ETOHism (HCC)   . Pneumonia   . Tobacco abuse     Patient Active Problem List   Diagnosis Date Noted  . 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 Radiculopathy due to lumbar intervertebral disc disorder 08/04/2016  . Macrocytic anemia 09/30/2015  . HNP (herniated nucleus pulposus), lumbar 09/27/2015  . Sepsis (HCC) 09/27/2015  . Alcohol use 09/27/2015  . Cellulitis of right lower leg 09/27/2015  . AKI (acute kidney injury) (HCC) 09/27/2015  . Tobacco abuse   . Bipolar disorder (HCC)   . Alcohol intoxication (HCC) 08/23/2012    Past Surgical History:  Procedure Laterality Date  . APPENDECTOMY    . HERNIA REPAIR    . SHOULDER ARTHROSCOPY         Family History  Problem Relation Age of Onset  . Hypertension Mother   . Hypertension Father    . Cancer Maternal Grandmother   . Heart disease Maternal Grandfather   . Cancer Paternal Grandmother   . Diabetes Paternal Grandmother   . Cancer Paternal Grandfather     Social History   Tobacco Use  . Smoking status: Current Every Day Smoker    Packs/day: 2.00    Years: 30.00    Pack years: 60.00    Types: Cigarettes    Start date: 03/11/1984  . Smokeless tobacco: Never Used  Vaping Use  . Vaping Use: Never used  Substance Use Topics  . Alcohol use: Yes    Alcohol/week: 6.0 standard drinks    Types: 6 Cans of beer per week  . Drug use: No    Comment: adderall    Home Medications Prior to Admission medications   Medication Sig Start Date End Date Taking? Authorizing Provider  benzonatate (TESSALON) 100 MG capsule Take 1 capsule (100 mg total) by mouth every 8 (eight) hours. 04/23/20  Yes Patricie Geeslin, PA-C  albuterol (PROVENTIL HFA;VENTOLIN HFA) 108 (90 Base) MCG/ACT inhaler Inhale 1-2 puffs into the lungs every 6 (six) hours as needed for wheezing or shortness of breath. 05/11/18   Claude Manges, PA-C  gabapentin (NEURONTIN) 300 MG capsule Take 1 capsule (300 mg total) by mouth 3 (three) times daily. Patient taking differently: Take 300 mg by mouth 2 (  two) times daily.  05/17/18   Charm Rings, NP    Allergies    Hydrochlorothiazide, Lisinopril, and Broccoli [brassica oleracea]  Review of Systems   Review of Systems  Constitutional: Negative for chills and fever.  Respiratory: Positive for cough (coughing up blood). Negative for shortness of breath.   Cardiovascular: Positive for chest pain (chronic).  All other systems reviewed and are negative.   Physical Exam Updated Vital Signs BP 128/89   Pulse (!) 109   Temp 98.5 F (36.9 C) (Oral)   Resp (!) 21   SpO2 96%   Physical Exam Vitals and nursing note reviewed.  Constitutional:      Appearance: He is obese. He is not ill-appearing or diaphoretic.  HENT:     Head: Normocephalic and atraumatic.  Eyes:      Conjunctiva/sclera: Conjunctivae normal.  Cardiovascular:     Rate and Rhythm: Normal rate and regular rhythm.     Pulses:          Radial pulses are 2+ on the right side and 2+ on the left side.       Dorsalis pedis pulses are 2+ on the right side and 2+ on the left side.     Heart sounds: Normal heart sounds.  Pulmonary:     Effort: Pulmonary effort is normal.     Breath sounds: Normal breath sounds. No decreased breath sounds, wheezing, rhonchi or rales.     Comments: Speaking in full sentences without difficulty. Satting 96% on RA. Not actively coughing in the room. LCTAB.  Chest:     Chest wall: No tenderness.  Abdominal:     Palpations: Abdomen is soft.     Tenderness: There is no abdominal tenderness. There is no guarding or rebound.  Musculoskeletal:     Cervical back: Neck supple.     Right lower leg: No tenderness. No edema.     Left lower leg: No tenderness. No edema.  Skin:    General: Skin is warm and dry.  Neurological:     Mental Status: He is alert.     ED Results / Procedures / Treatments   Labs (all labs ordered are listed, but only abnormal results are displayed) Labs Reviewed  BASIC METABOLIC PANEL - Abnormal; Notable for the following components:      Result Value   Sodium 134 (*)    Glucose, Bld 115 (*)    BUN 5 (*)    Creatinine, Ser 0.60 (*)    All other components within normal limits  CBC - Abnormal; Notable for the following components:   MCH 34.3 (*)    Platelets 128 (*)    All other components within normal limits  BRAIN NATRIURETIC PEPTIDE  TROPONIN I (HIGH SENSITIVITY)  TROPONIN I (HIGH SENSITIVITY)    EKG None  Radiology DG Chest 2 View  Result Date: 04/23/2020 CLINICAL DATA:  Chest pain and shortness of breath. EXAM: CHEST - 2 VIEW COMPARISON:  05/11/2018 FINDINGS: The lungs are clear without focal pneumonia, edema, pneumothorax or pleural effusion. The cardiopericardial silhouette is within normal limits for size. The  visualized bony structures of the thorax show no acute abnormality. IMPRESSION: No active cardiopulmonary disease. Electronically Signed   By: Kennith Center M.D.   On: 04/23/2020 13:18   CT Angio Chest PE W/Cm &/Or Wo Cm  Result Date: 04/23/2020 CLINICAL DATA:  Shortness of breath. EXAM: CT ANGIOGRAPHY CHEST WITH CONTRAST TECHNIQUE: Multidetector CT imaging of the chest was performed using  the standard protocol during bolus administration of intravenous contrast. Multiplanar CT image reconstructions and MIPs were obtained to evaluate the vascular anatomy. CONTRAST:  80mL OMNIPAQUE IOHEXOL 350 MG/ML SOLN COMPARISON:  None. FINDINGS: Cardiovascular: Satisfactory opacification of the pulmonary arteries to the segmental level. No evidence of pulmonary embolism. Normal heart size. No pericardial effusion. Atherosclerosis of thoracic aorta is noted without aneurysm formation. Mediastinum/Nodes: No enlarged mediastinal, hilar, or axillary lymph nodes. Thyroid gland, trachea, and esophagus demonstrate no significant findings. Lungs/Pleura: Lungs are clear. No pleural effusion or pneumothorax. Upper Abdomen: No acute abnormality. Musculoskeletal: No chest wall abnormality. No acute or significant osseous findings. Review of the MIP images confirms the above findings. IMPRESSION: 1. No definite evidence of pulmonary embolus. 2. No acute abnormality seen in the chest. 3. Aortic atherosclerosis. Aortic Atherosclerosis (ICD10-I70.0). Electronically Signed   By: Lupita Raider M.D.   On: 04/23/2020 18:08    Procedures Procedures   Medications Ordered in ED Medications  sodium chloride 0.9 % bolus 1,000 mL (1,000 mLs Intravenous New Bag/Given 04/23/20 1645)  iohexol (OMNIPAQUE) 350 MG/ML injection 60 mL (60 mLs Intravenous Contrast Given 04/23/20 1737)    ED Course  I have reviewed the triage vital signs and the nursing notes.  Pertinent labs & imaging results that were available during my care of the patient  were reviewed by me and considered in my medical decision making (see chart for details).    MDM Rules/Calculators/A&P                          45 year old male who presents to the ED today complaining of coughing up white sputum since receiving his second Covid vaccine in November.  Reports that it recently changed to coughing up blood causing him concern.  He also complains of chest pain while in triage however states that this is chronic as he is a current every day smoker and always has chest/lung pain.  On arrival to the ED patient is afebrile.  He is tachycardic in the 110s and tachypneic at 25 however it does appear patient was very anxious while in triage.  Satting 100% on room air.  EKG with sinus tachycardia, no acute ischemic changes.  Chest x-ray clear.  Initial troponin in the waiting room 7.  Interval labs including CBC and BMP unremarkable.  When patient is brought back to the room he is no longer tachypneic.  He is able to speak in full sentences without difficulty and his lungs are clear to auscultation bilaterally.  He is not coughing at all in the room.  Do suspect patient may have a chronic cough related to his heavy smoking use of 2 packs/day and his trachea and esophagus may be irritated from this however will obtain CTA at this time to rule out PE given hemoptysis. Will also repeat troponin and obtain BNP.   Repeat troponin decreased at 6 BNP negative at 15.5 CTA negative for PE or other findings.   On reevaluation pt resting comfortably. Continues to be tachycardic however appears pt is chronically tachycardic. Suspect this is also related to his heavy smoking and obesity. Will discharge pt home at this time. He is getting insurance in the next 3 weeks and will establish care with PCP at that time. Will prescribe tessalon perles for cough at this time. Had lengthy discussion with pt regarding smoking cessation; he reports he used to smoke 3 ppd and feels like he is doing better.  He  does not seem interested in quitting at this time. Stable for discharge home.   This note was prepared using Dragon voice recognition software and may include unintentional dictation errors due to the inherent limitations of voice recognition software.   Final Clinical Impression(s) / ED Diagnoses Final diagnoses:  Coughing up blood  Tobacco abuse counseling    Rx / DC Orders ED Discharge Orders         Ordered    benzonatate (TESSALON) 100 MG capsule  Every 8 hours        04/23/20 1836           Discharge Instructions     Your labwork and imaging were very reassuring at this time. I suspect your coughing up blood may be related to your smoking and irritation in your throat from the cough. Please take cough medication as prescribed however smoking cessation will ultimately help with this.   Once your insurance kicks in please establish care with primary care for further evaluation.   Return to the ED for any worsening symptoms       Tanda Rockers, PA-C 04/23/20 1837    Virgina Norfolk, DO 04/25/20 [phone]

## 2020-04-23 NOTE — ED Triage Notes (Addendum)
Pt states he had his second covid shot in November and has felt horrible since. Pt states he has been coughing ever since and a few weeks ago started coughing up blood. Pt states he is coughing up approx 1/2 tsp an hour. Pt states his lungs hurt. 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 Pt also complains of CP. Of note, pt also has rash over abd that states has been there for a year. Pt is very tearful and afraid.

## 2020-04-23 NOTE — Discharge Instructions (Signed)
Your labwork and imaging were very reassuring at this time. I suspect your coughing up blood may be related to your smoking and irritation in your throat from the cough. Please take cough medication as prescribed however smoking cessation will ultimately help with this.   Once your insurance kicks in please establish care with primary care for further evaluation.   Return to the ED for any worsening symptoms

## 2020-06-11 ENCOUNTER — Emergency Department (HOSPITAL_COMMUNITY): Payer: Managed Care, Other (non HMO)

## 2020-06-11 ENCOUNTER — Emergency Department (HOSPITAL_COMMUNITY)
Admission: EM | Admit: 2020-06-11 | Discharge: 2020-06-11 | Disposition: A | Discharge status: Home / Self Care | Payer: Managed Care, Other (non HMO) | Attending: Emergency Medicine | Admitting: Emergency Medicine

## 2020-06-11 ENCOUNTER — Encounter (HOSPITAL_COMMUNITY): Payer: Self-pay | Admitting: *Deleted

## 2020-06-11 ENCOUNTER — Other Ambulatory Visit: Payer: Self-pay

## 2020-06-11 DIAGNOSIS — R000 Tachycardia, unspecified: Secondary | ICD-10-CM | POA: Diagnosis not present

## 2020-06-11 DIAGNOSIS — R7401 Elevation of levels of liver transaminase levels: Secondary | ICD-10-CM | POA: Diagnosis not present

## 2020-06-11 DIAGNOSIS — X58XXXA Exposure to other specified factors, initial encounter: Secondary | ICD-10-CM | POA: Diagnosis not present

## 2020-06-11 DIAGNOSIS — K760 Fatty (change of) liver, not elsewhere classified: Secondary | ICD-10-CM | POA: Diagnosis not present

## 2020-06-11 DIAGNOSIS — S39012A Strain of muscle, fascia and tendon of lower back, initial encounter: Secondary | ICD-10-CM | POA: Diagnosis not present

## 2020-06-11 DIAGNOSIS — F17210 Nicotine dependence, cigarettes, uncomplicated: Secondary | ICD-10-CM | POA: Insufficient documentation

## 2020-06-11 DIAGNOSIS — S3992XA Unspecified injury of lower back, initial encounter: Secondary | ICD-10-CM | POA: Diagnosis present

## 2020-06-11 LAB — COMPREHENSIVE METABOLIC PANEL
ALT: 57 U/L — ABNORMAL HIGH (ref 0–44)
AST: 126 U/L — ABNORMAL HIGH (ref 15–41)
Albumin: 3.5 g/dL (ref 3.5–5.0)
Alkaline Phosphatase: 92 U/L (ref 38–126)
Anion gap: 10 (ref 5–15)
BUN: <5 mg/dL — ABNORMAL LOW (ref 6–20)
CO2: 24 mmol/L (ref 22–32)
Calcium: 8.7 mg/dL — ABNORMAL LOW (ref 8.9–10.3)
Chloride: 101 mmol/L (ref 98–111)
Creatinine, Ser: 0.59 mg/dL — ABNORMAL LOW (ref 0.61–1.24)
GFR, Estimated: >60 mL/min (ref >60)
Glucose, Bld: 91 mg/dL (ref 70–99)
Potassium: 3.6 mmol/L (ref 3.5–5.1)
Sodium: 135 mmol/L (ref 135–145)
Total Bilirubin: 2.5 mg/dL — ABNORMAL HIGH (ref 0.3–1.2)
Total Protein: 7.3 g/dL (ref 6.5–8.1)

## 2020-06-11 LAB — URINALYSIS, ROUTINE W REFLEX MICROSCOPIC
Bacteria, UA: NONE SEEN
Bilirubin Urine: NEGATIVE
Glucose, UA: NEGATIVE mg/dL
Ketones, ur: NEGATIVE mg/dL
Leukocytes,Ua: NEGATIVE
Nitrite: NEGATIVE
Protein, ur: NEGATIVE mg/dL
Specific Gravity, Urine: 1.011 (ref 1.005–1.030)
pH: 5 (ref 5.0–8.0)

## 2020-06-11 LAB — CBC
HCT: 43.7 % (ref 39.0–52.0)
Hemoglobin: 14.4 g/dL (ref 13.0–17.0)
MCH: 34.1 pg — ABNORMAL HIGH (ref 26.0–34.0)
MCHC: 33 g/dL (ref 30.0–36.0)
MCV: 103.6 fL — ABNORMAL HIGH (ref 80.0–100.0)
Platelets: 94 10*3/uL — ABNORMAL LOW (ref 150–400)
RBC: 4.22 MIL/uL (ref 4.22–5.81)
RDW: 14.5 % (ref 11.5–15.5)
WBC: 7.5 10*3/uL (ref 4.0–10.5)
nRBC: 0 % (ref 0.0–0.2)

## 2020-06-11 LAB — LIPASE, BLOOD: Lipase: 59 U/L — ABNORMAL HIGH (ref 11–51)

## 2020-06-11 MED ORDER — NAPROXEN 500 MG PO TABS: 500.0000 mg | ORAL_TABLET | Freq: Two times a day (BID) | ORAL | 0 refills | Status: AC

## 2020-06-11 MED ORDER — METHOCARBAMOL 500 MG PO TABS: 500.0000 mg | ORAL_TABLET | Freq: Two times a day (BID) | ORAL | 0 refills | Status: AC

## 2020-06-11 MED ORDER — OXYCODONE-ACETAMINOPHEN 5-325 MG PO TABS
1.0000 | ORAL_TABLET | ORAL | Status: AC | PRN
  Administered 2020-06-11 (×2): 1 via ORAL
  Filled 2020-06-11 (×2): qty 1

## 2020-06-11 MED ORDER — METHOCARBAMOL 500 MG PO TABS
500.0000 mg | ORAL_TABLET | Freq: Once | ORAL | Status: AC
  Administered 2020-06-11: 500 mg via ORAL
  Filled 2020-06-11: qty 1

## 2020-06-11 MED ORDER — ONDANSETRON 4 MG PO TBDP
4.0000 mg | ORAL_TABLET | Freq: Once | ORAL | Status: AC | PRN
  Administered 2020-06-11: 4 mg via ORAL
  Filled 2020-06-11: qty 1

## 2020-06-11 MED ORDER — KETOROLAC TROMETHAMINE 30 MG/ML IJ SOLN
30.0000 mg | Freq: Once | INTRAMUSCULAR | Status: AC
  Administered 2020-06-11: 30 mg via INTRAVENOUS
  Filled 2020-06-11: qty 1

## 2020-06-11 MED ORDER — HYDROMORPHONE HCL 1 MG/ML IJ SOLN
1.0000 mg | Freq: Once | INTRAMUSCULAR | Status: AC
  Administered 2020-06-11: 1 mg via INTRAVENOUS
  Filled 2020-06-11: qty 1

## 2020-06-11 NOTE — Discharge Instructions (Signed)
Take medications to help with your symptoms. Your liver labs were elevated today.  You may have evidence of fatty liver disease on your CT.  You will need to tell your primary care provider about this. There is no evidence that there is a kidney stone or any kidney infection on your work-up today. Return to the ER if you start to experience worsening symptoms, chest pain, shortness of breath or bloody stools

## 2020-06-11 NOTE — ED Provider Notes (Signed)
[facility] EMERGENCY DEPARTMENT Gavriela Cashin Note   CSN: [id number] Arrival date & time: 06/11/20  1918     History Chief Complaint  Patient presents with  . Flank Pain    Troy Ponce is a 45 y.o. male with a past medical history of tobacco abuse, alcohol abuse presenting to the ED with a chief complaint of flank pain.  Reports 1 week history of intermittent bilateral flank pain that will radiate to his abdomen.  He reports some difficulty urinary and feeling of incomplete voiding.  He was seen and evaluated by PCP today and sent to the ER for possible kidney stones versus blockage.  He states that he has had not had this type of pain in the past.  He denies any gross hematuria.  Denies any fevers, chest pain, shortness of breath, vomiting.  He is unsure if he has had kidney stones in the past but does not recall any procedures related to this.  HPI     Past Medical History:  Diagnosis Date  . Allergy   . Bipolar disorder (HCC)   . ETOH abuse   . ETOHism (HCC)   . Pneumonia   . Tobacco abuse     Patient Active Problem List   Diagnosis Date Noted  . 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 Radiculopathy due to lumbar intervertebral disc disorder 08/04/2016  . Macrocytic anemia 09/30/2015  . HNP (herniated nucleus pulposus), lumbar 09/27/2015  . Sepsis (HCC) 09/27/2015  . Alcohol use 09/27/2015  . Cellulitis of right lower leg 09/27/2015  . AKI (acute kidney injury) (HCC) 09/27/2015  . Tobacco abuse   . Bipolar disorder (HCC)   . Alcohol intoxication (HCC) 08/23/2012    Past Surgical History:  Procedure Laterality Date  . APPENDECTOMY    . HERNIA REPAIR    . SHOULDER ARTHROSCOPY         Family History  Problem Relation Age of Onset  . Hypertension Mother   . Hypertension Father   . Cancer Maternal Grandmother   . Heart disease Maternal Grandfather   . Cancer Paternal Grandmother   . Diabetes Paternal  Grandmother   . Cancer Paternal Grandfather     Social History   Tobacco Use  . Smoking status: Current Every Day Smoker    Packs/day: 2.00    Years: 30.00    Pack years: 60.00    Types: Cigarettes    Start date: 03/11/1984  . Smokeless tobacco: Never Used  Vaping Use  . Vaping Use: Never used  Substance Use Topics  . Alcohol use: Yes    Alcohol/week: 6.0 standard drinks    Types: 6 Cans of beer per week  . Drug use: No    Comment: adderall    Home Medications Prior to Admission medications   Medication Sig Start Date End Date Taking? Authorizing Sohan Potvin  methocarbamol (ROBAXIN) 500 MG tablet Take 1 tablet (500 mg total) by mouth 2 (two) times daily. 06/11/20  Yes , , PA-C  naproxen (NAPROSYN) 500 MG tablet Take 1 tablet (500 mg total) by mouth 2 (two) times daily. 06/11/20  Yes , , PA-C  albuterol (PROVENTIL HFA;VENTOLIN HFA) 108 (90 Base) MCG/ACT inhaler Inhale 1-2 puffs into the lungs every 6 (six) hours as needed for wheezing or shortness of breath. 05/11/18   Claude Manges, PA-C  benzonatate (TESSALON) 100 MG capsule Take 1 capsule (100 mg total) by mouth every 8 (eight) hours. 04/23/20   Tanda Rockers, PA-C  gabapentin (NEURONTIN) 300 MG capsule Take 1 capsule (300 mg total) by mouth 3 (three) times daily. Patient taking differently: Take 300 mg by mouth 2 (two) times daily.  05/17/18   Charm RingsLord, Jamison Y, NP    Allergies    Hydrochlorothiazide, Lisinopril, and Broccoli [brassica oleracea]  Review of Systems   Review of Systems  Constitutional: Negative for appetite change, chills and fever.  HENT: Negative for ear pain, rhinorrhea, sneezing and sore throat.   Eyes: Negative for photophobia and visual disturbance.  Respiratory: Negative for cough, chest tightness, shortness of breath and wheezing.   Cardiovascular: Negative for chest pain and palpitations.  Gastrointestinal: Negative for abdominal pain, blood in stool, constipation, diarrhea, nausea  and vomiting.  Genitourinary: Positive for flank pain. Negative for dysuria, hematuria and urgency.  Musculoskeletal: Negative for myalgias.  Skin: Negative for rash.  Neurological: Negative for dizziness, weakness and light-headedness.    Physical Exam Updated Vital Signs BP (!) 136/91   Pulse (!) 103   Temp 99 F (37.2 C) (Oral)   Resp 18   SpO2 98%   Physical Exam Vitals and nursing note reviewed.  Constitutional:      General: He is not in acute distress.    Appearance: He is well-developed.     Comments: Appears uncomfortable.  HENT:     Head: Normocephalic and atraumatic.     Nose: Nose normal.  Eyes:     General: No scleral icterus.       Left eye: No discharge.     Conjunctiva/sclera: Conjunctivae normal.  Cardiovascular:     Rate and Rhythm: Regular rhythm. Tachycardia present.     Heart sounds: Normal heart sounds. No murmur heard. No friction rub. No gallop.   Pulmonary:     Effort: Pulmonary effort is normal. No respiratory distress.     Breath sounds: Normal breath sounds.  Abdominal:     General: Bowel sounds are normal. There is no distension.     Palpations: Abdomen is soft.     Tenderness: There is no abdominal tenderness. There is right CVA tenderness and left CVA tenderness. There is no guarding.  Musculoskeletal:        General: Normal range of motion.     Cervical back: Normal range of motion and neck supple.  Skin:    General: Skin is warm and dry.     Findings: No rash.  Neurological:     Mental Status: He is alert.     Motor: No abnormal muscle tone.     Coordination: Coordination normal.     ED Results / Procedures / Treatments   Labs (all labs ordered are listed, but only abnormal results are displayed) Labs Reviewed  LIPASE, BLOOD - Abnormal; Notable for the following components:      Result Value   Lipase 59 (*)    All other components within normal limits  COMPREHENSIVE METABOLIC PANEL - Abnormal; Notable for the following  components:   BUN <5 (*)    Creatinine, Ser 0.59 (*)    Calcium 8.7 (*)    AST 126 (*)    ALT 57 (*)    Total Bilirubin 2.5 (*)    All other components within normal limits  CBC - Abnormal; Notable for the following components:   MCV 103.6 (*)    MCH 34.1 (*)    Platelets 94 (*)    All other components within normal limits  URINALYSIS, ROUTINE W REFLEX MICROSCOPIC - Abnormal; Notable for the  following components:   Color, Urine AMBER (*)    Hgb urine dipstick MODERATE (*)    All other components within normal limits    EKG None  Radiology CT Renal Stone Study  Result Date: 06/11/2020 CLINICAL DATA:  Bilateral flank pain EXAM: CT ABDOMEN AND PELVIS WITHOUT CONTRAST TECHNIQUE: Multidetector CT imaging of the abdomen and pelvis was performed following the standard protocol without IV contrast. COMPARISON:  None. FINDINGS: Lower chest: No acute abnormality. Hepatobiliary: Diffuse low-density throughout the liver compatible with fatty infiltration. No focal abnormality. Gallbladder unremarkable. Pancreas: No focal abnormality or ductal dilatation. Spleen: No focal abnormality.  Normal size. Adrenals/Urinary Tract: Small hyperdense cyst off the upper pole of the left kidney, likely hemorrhagic cyst. Parenchymal calcification within the lower pole of the left kidney. Adrenal glands unremarkable. No hydronephrosis. Urinary bladder unremarkable. Stomach/Bowel: Appears to been prior appendectomy. Stomach, large and small bowel grossly unremarkable. Vascular/Lymphatic: Aortic atherosclerosis. No evidence of aneurysm or adenopathy. Reproductive: Central prostate calcifications. Other: No free fluid or free air. There is stranding in the right lower quadrant along the anterior pararenal fascia and retroperitoneum. This tracks anterior to the psoas muscle into the upper pelvis. No visible cause for this stranding. This does not appear localized around a bowel loop and bowel in the right lower quadrant is  unremarkable appearing. Prior appendectomy. Musculoskeletal: Degenerative changes at L5-S1. IMPRESSION: Hepatic steatosis. Evidence of portal venous hypertension with collateral venous channels and recanalized umbilical vein. There is stranding in the right lower quadrant along the pararenal fascia anterior to the right psoas muscle of unknown etiology. The patient has previously undergone appendectomy. There is no evidence of abnormal bowel loops in the right lower quadrant and the stranding is separate from bowel loops. This may be changes related to collateral venous blood flow and portal venous hypertension. No renal or ureteral stones.  No hydronephrosis. Aortic atherosclerosis. Electronically Signed   By: Charlett Nose M.D.   On: 06/11/2020 22:28    Procedures Procedures   Medications Ordered in ED Medications  oxyCODONE-acetaminophen (PERCOCET/ROXICET) 5-325 MG per tablet 1 tablet (1 tablet Oral Given 06/11/20 2006)  ketorolac (TORADOL) 30 MG/ML injection 30 mg (has no administration in time range)  methocarbamol (ROBAXIN) tablet 500 mg (has no administration in time range)  ondansetron (ZOFRAN-ODT) disintegrating tablet 4 mg (4 mg Oral Given 06/11/20 2006)  HYDROmorphone (DILAUDID) injection 1 mg (1 mg Intravenous Given 06/11/20 2212)    ED Course  I have reviewed the triage vital signs and the nursing notes.  Pertinent labs & imaging results that were available during my care of the patient were reviewed by me and considered in my medical decision making (see chart for details).  Clinical Course as of 06/11/20 2313  Mon Jun 11, 2020  2310 Hgb urine dipstickMarland Kitchen): MODERATE [HK]  2310 Bacteria, UA: NONE SEEN [HK]  2310 WBC, UA: 0-5 [HK]  2310 Leukocytes,Ua: NEGATIVE [HK]  2310 Creatinine(!): 0.59 [HK]    Clinical Course User Index [HK] Dietrich Pates, PA-C   MDM Rules/Calculators/A&P                          45 year old male with past medical history of tobacco abuse, alcohol abuse  presenting to the ED with a chief complaint of bilateral flank pain.  1 week history of intermittent bilateral flank pain that will radiate to abdomen.  Reports some difficulty urinating and feeling of incomplete voiding the past week as well.  Sent to  the ER from PCP due to possible kidney stones.  He is unsure if he has had kidney stones in the past but has not had any procedures related to this.  Denies any gross hematuria.  No chest pain, fever, shortness of breath or vomiting.  On exam patient initially tachycardic which I feel is due to to his pain.  He has bilateral CVA tenderness but abdomen is soft.  He is afebrile here.  No leukocytosis here.  He does have some elevation in LFTs which I feel is due to his known alcohol use.  He has no right upper quadrant pain or tenderness. Urinalysis with hemoglobin but no evidence of infection.  Lipase of 59.  CT renal stone study without any evidence of kidney stone.  There is nonspecific stranding in the right lower quadrant the patient is status post appendectomy and no evidence of obstruction.  On recheck patient with significant improvement in his pain after Dilaudid.  Informed him of results of work-up today.  Informed him that he should follow-up with primary care Cybill Uriegas regarding his transaminitis as well as liver disease seen on CT.  In the meantime we will treat with muscle relaxer and NSAIDs in hopes that this will help but could be due to musculoskeletal pain.  He remains ambulatory here and afebrile.  He denies any respiratory complaints.  He is comfortable with discharge home.  Return precautions given.   Patient is hemodynamically stable, in NAD, and able to ambulate in the ED. Evaluation does not show pathology that would require ongoing emergent intervention or inpatient treatment. I explained the diagnosis to the patient. Pain has been managed and has no complaints prior to discharge. Patient is comfortable with above plan and is stable for  discharge at this time. All questions were answered prior to disposition. Strict return precautions for returning to the ED were discussed. Encouraged follow up with PCP.   An After Visit Summary was printed and given to the patient.   Portions of this note were generated with Scientist, clinical (histocompatibility and immunogenetics). Dictation errors may occur despite best attempts at proofreading.  Final Clinical Impression(s) / ED Diagnoses Final diagnoses:  Strain of lumbar region, initial encounter  Transaminitis  Hepatic steatosis    Rx / DC Orders ED Discharge Orders         Ordered    methocarbamol (ROBAXIN) 500 MG tablet  2 times daily        06/11/20 2310    naproxen (NAPROSYN) 500 MG tablet  2 times daily        06/11/20 2310           Dietrich Pates, PA-C 06/11/20 2313    Milagros Loll, MD 06/12/20 1942

## 2020-06-11 NOTE — ED Triage Notes (Signed)
Pt c/o bilateral flank pain intermittently for the past week. Pt sent by doctor for kidney stones. Reports difficulty urinating

## 2020-10-10 ENCOUNTER — Encounter (HOSPITAL_COMMUNITY): Payer: Self-pay | Admitting: Emergency Medicine

## 2020-10-10 ENCOUNTER — Emergency Department (HOSPITAL_COMMUNITY)
Admission: EM | Admit: 2020-10-10 | Discharge: 2020-10-10 | Disposition: A | Discharge status: Home / Self Care | Payer: Managed Care, Other (non HMO) | Attending: Emergency Medicine | Admitting: Emergency Medicine

## 2020-10-10 ENCOUNTER — Other Ambulatory Visit: Payer: Self-pay

## 2020-10-10 DIAGNOSIS — Y908 Blood alcohol level of 240 mg/100 ml or more: Secondary | ICD-10-CM | POA: Diagnosis not present

## 2020-10-10 DIAGNOSIS — F10930 Alcohol use, unspecified with withdrawal, uncomplicated: Secondary | ICD-10-CM

## 2020-10-10 DIAGNOSIS — F10139 Alcohol abuse with withdrawal, unspecified: Secondary | ICD-10-CM | POA: Diagnosis present

## 2020-10-10 DIAGNOSIS — F600 Paranoid personality disorder: Secondary | ICD-10-CM | POA: Insufficient documentation

## 2020-10-10 DIAGNOSIS — F17210 Nicotine dependence, cigarettes, uncomplicated: Secondary | ICD-10-CM | POA: Insufficient documentation

## 2020-10-10 DIAGNOSIS — F10230 Alcohol dependence with withdrawal, uncomplicated: Secondary | ICD-10-CM

## 2020-10-10 LAB — COMPREHENSIVE METABOLIC PANEL
ALT: 56 U/L — ABNORMAL HIGH (ref 0–44)
AST: 128 U/L — ABNORMAL HIGH (ref 15–41)
Albumin: 3.5 g/dL (ref 3.5–5.0)
Alkaline Phosphatase: 128 U/L — ABNORMAL HIGH (ref 38–126)
Anion gap: 9 (ref 5–15)
BUN: 7 mg/dL (ref 6–20)
CO2: 26 mmol/L (ref 22–32)
Calcium: 8.6 mg/dL — ABNORMAL LOW (ref 8.9–10.3)
Chloride: 106 mmol/L (ref 98–111)
Creatinine, Ser: 0.49 mg/dL — ABNORMAL LOW (ref 0.61–1.24)
GFR, Estimated: >60 mL/min (ref >60)
Glucose, Bld: 118 mg/dL — ABNORMAL HIGH (ref 70–99)
Potassium: 3.8 mmol/L (ref 3.5–5.1)
Sodium: 141 mmol/L (ref 135–145)
Total Bilirubin: 1.4 mg/dL — ABNORMAL HIGH (ref 0.3–1.2)
Total Protein: 8.1 g/dL (ref 6.5–8.1)

## 2020-10-10 LAB — CBC WITH DIFFERENTIAL/PLATELET
Abs Immature Granulocytes: 0.02 10*3/uL (ref 0.00–0.07)
Basophils Absolute: 0.1 10*3/uL (ref 0.0–0.1)
Basophils Relative: 1 %
Eosinophils Absolute: 0.1 10*3/uL (ref 0.0–0.5)
Eosinophils Relative: 2 %
HCT: 43 % (ref 39.0–52.0)
Hemoglobin: 14.4 g/dL (ref 13.0–17.0)
Immature Granulocytes: 0 %
Lymphocytes Relative: 24 %
Lymphs Abs: 1.6 10*3/uL (ref 0.7–4.0)
MCH: 33.8 pg (ref 26.0–34.0)
MCHC: 33.5 g/dL (ref 30.0–36.0)
MCV: 100.9 fL — ABNORMAL HIGH (ref 80.0–100.0)
Monocytes Absolute: 0.7 10*3/uL (ref 0.1–1.0)
Monocytes Relative: 11 %
Neutro Abs: 4.2 10*3/uL (ref 1.7–7.7)
Neutrophils Relative %: 62 %
Platelets: 95 10*3/uL — ABNORMAL LOW (ref 150–400)
RBC: 4.26 MIL/uL (ref 4.22–5.81)
RDW: 15.6 % — ABNORMAL HIGH (ref 11.5–15.5)
WBC: 6.7 10*3/uL (ref 4.0–10.5)
nRBC: 0 % (ref 0.0–0.2)

## 2020-10-10 LAB — MAGNESIUM: Magnesium: 2 mg/dL (ref 1.7–2.4)

## 2020-10-10 LAB — LIPASE, BLOOD: Lipase: 82 U/L — ABNORMAL HIGH (ref 11–51)

## 2020-10-10 LAB — ETHANOL: Alcohol, Ethyl (B): 280 mg/dL — ABNORMAL HIGH (ref <10)

## 2020-10-10 MED ORDER — LORAZEPAM 2 MG/ML IJ SOLN
2.0000 mg | Freq: Once | INTRAMUSCULAR | Status: DC
  Filled 2020-10-10: qty 1

## 2020-10-10 MED ORDER — LORAZEPAM 1 MG PO TABS: 0.0000 mg | ORAL_TABLET | Freq: Two times a day (BID) | ORAL | Status: DC

## 2020-10-10 MED ORDER — KETOROLAC TROMETHAMINE 30 MG/ML IJ SOLN
15.0000 mg | Freq: Once | INTRAMUSCULAR | Status: AC
  Administered 2020-10-10: 15 mg via INTRAVENOUS
  Filled 2020-10-10: qty 1

## 2020-10-10 MED ORDER — LORAZEPAM 1 MG PO TABS: 0.0000 mg | ORAL_TABLET | Freq: Four times a day (QID) | ORAL | Status: DC

## 2020-10-10 MED ORDER — ONDANSETRON 4 MG PO TBDP: 4.0000 mg | ORAL_TABLET | Freq: Three times a day (TID) | ORAL | 0 refills | Status: AC | PRN

## 2020-10-10 MED ORDER — LORAZEPAM 2 MG/ML IJ SOLN: 0.0000 mg | Freq: Two times a day (BID) | INTRAMUSCULAR | Status: DC

## 2020-10-10 MED ORDER — ONDANSETRON HCL 4 MG/2ML IJ SOLN
4.0000 mg | Freq: Once | INTRAMUSCULAR | Status: AC
  Administered 2020-10-10: 4 mg via INTRAVENOUS
  Filled 2020-10-10: qty 2

## 2020-10-10 MED ORDER — NICOTINE 21 MG/24HR TD PT24
21.0000 mg | MEDICATED_PATCH | Freq: Every day | TRANSDERMAL | Status: DC
  Administered 2020-10-10: 21 mg via TRANSDERMAL
  Filled 2020-10-10: qty 1

## 2020-10-10 MED ORDER — THIAMINE HCL 100 MG PO TABS: 100.0000 mg | ORAL_TABLET | Freq: Every day | ORAL | Status: DC

## 2020-10-10 MED ORDER — LACTATED RINGERS IV BOLUS
1000.0000 mL | Freq: Once | INTRAVENOUS | Status: AC
  Administered 2020-10-10: 1000 mL via INTRAVENOUS

## 2020-10-10 MED ORDER — THIAMINE HCL 100 MG/ML IJ SOLN
100.0000 mg | Freq: Every day | INTRAMUSCULAR | Status: DC
  Administered 2020-10-10: 100 mg via INTRAVENOUS
  Filled 2020-10-10: qty 2

## 2020-10-10 MED ORDER — LORAZEPAM 2 MG/ML IJ SOLN
0.0000 mg | Freq: Four times a day (QID) | INTRAMUSCULAR | Status: DC
  Administered 2020-10-10: 1 mg via INTRAVENOUS
  Filled 2020-10-10: qty 1

## 2020-10-10 MED ORDER — CHLORDIAZEPOXIDE HCL 25 MG PO CAPS
ORAL_CAPSULE | ORAL | 0 refills | Status: DC
Start: 2020-10-10 — End: 2020-11-14

## 2020-10-10 NOTE — ED Triage Notes (Signed)
Patient requesting alcohol detox. Went to [facility] this morning and sent for further evaluation. States he drinks 25-30 beers/day. Last drink x2 hours ago. Requesting librium and nicotine patch.

## 2020-10-10 NOTE — ED Notes (Addendum)
Pt will not keep cardiac monitoring on. Pt ambulatory to the restroom. Urine specimen collected and sent to lab. Complete bed linen changed. Pt returned to bedside and given ham and [place] sandwich along with water to drink.

## 2020-10-10 NOTE — ED Notes (Signed)
Patient ambulatory to restroom  ?

## 2020-10-10 NOTE — ED Notes (Signed)
Cigarettes and lighter given to security in labeled bag.

## 2020-10-10 NOTE — ED Provider Notes (Signed)
[facility]-EMERGENCY DEPT Provider Note   CSN: [id number] Arrival date & time: 10/10/20  1047     History Chief Complaint  Patient presents with   Alcohol Problem    Troy Ponce is a 45 y.o. male.  45 year old male with past medical history below including alcohol abuse, bipolar disorder who presents with alcohol withdrawal.  Patient went to [facility] today for alcohol detox but was referred here due to withdrawal symptoms.  Patient states that he drinks 30 beers daily and his last drink was 2 hours ago.  He reports that his alcohol abuse has dramatically worsened lately and he has to drink constantly due to withdrawal symptoms.  He reports nausea and tremulousness.  He denies any other substance use.  He has never had an alcohol withdrawal seizure.  The history is provided by the patient.  Alcohol Problem      Past Medical History:  Diagnosis Date   Allergy    Bipolar disorder (HCC)    ETOH abuse    ETOHism (HCC)    Pneumonia    Tobacco abuse     Patient Active Problem List   Diagnosis Date Noted   Alcohol abuse with alcohol-induced mood disorder (HCC) 05/17/2018   Radiculopathy due to lumbar intervertebral disc disorder 08/04/2016   Macrocytic anemia 09/30/2015   HNP (herniated nucleus pulposus), lumbar 09/27/2015   Sepsis (HCC) 09/27/2015   Alcohol use 09/27/2015   Cellulitis of right lower leg 09/27/2015   AKI (acute kidney injury) (HCC) 09/27/2015   Tobacco abuse    Bipolar disorder (HCC)    Alcohol intoxication (HCC) 08/23/2012    Past Surgical History:  Procedure Laterality Date   APPENDECTOMY     HERNIA REPAIR     SHOULDER ARTHROSCOPY         Family History  Problem Relation Age of Onset   Hypertension Mother    Hypertension Father    Cancer Maternal Grandmother    Heart disease Maternal Grandfather    Cancer Paternal Grandmother    Diabetes Paternal Grandmother    Cancer Paternal Grandfather     Social History    Tobacco Use   Smoking status: Every Day    Packs/day: 2.00    Years: 30.00    Pack years: 60.00    Types: Cigarettes    Start date: 03/11/1984   Smokeless tobacco: Never  Vaping Use   Vaping Use: Never used  Substance Use Topics   Alcohol use: Yes    Alcohol/week: 6.0 standard drinks    Types: 6 Cans of beer per week   Drug use: No    Comment: adderall    Home Medications Prior to Admission medications   Medication Sig Start Date End Date Taking? Authorizing Provider  amLODipine (NORVASC) 10 MG tablet Take 10 mg by mouth at bedtime.   Yes [provider]  amphetamine-dextroamphetamine (ADDERALL) 20 MG tablet Take 20-60 mg by mouth daily.   Yes [provider]  chlordiazePOXIDE (LIBRIUM) 25 MG capsule 50mg  PO TID x 1D, then 25-50mg  PO BID X 1D, then 25-50mg  PO QD X 1D 10/10/20  Yes Margueritte Guthridge, 10/12/20, MD  chlorhexidine (PERIDEX) 0.12 % solution Use as directed 15 mLs in the mouth or throat 2 (two) times daily.   Yes [provider]  omeprazole (PRILOSEC) 20 MG capsule Take 20 mg by mouth 2 (two) times daily before a meal.   Yes [provider]  ondansetron (ZOFRAN ODT) 4 MG disintegrating tablet Take 1  tablet (4 mg total) by mouth every 8 (eight) hours as needed for nausea or vomiting. 10/10/20  Yes Emanuel Campos, Ambrose Finland, MD  sildenafil (VIAGRA) 100 MG tablet Take 100 mg by mouth daily as needed for erectile dysfunction.   Yes [provider]  albuterol (PROVENTIL HFA;VENTOLIN HFA) 108 (90 Base) MCG/ACT inhaler Inhale 1-2 puffs into the lungs every 6 (six) hours as needed for wheezing or shortness of breath. 05/11/18   Claude Manges, PA-C  benzonatate (TESSALON) 100 MG capsule Take 1 capsule (100 mg total) by mouth every 8 (eight) hours. Patient not taking: No sig reported 04/23/20   Tanda Rockers, PA-C  gabapentin (NEURONTIN) 300 MG capsule Take 1 capsule (300 mg total) by mouth 3 (three) times daily. Patient not taking: Reported on  10/10/2020 05/17/18   Charm Rings, NP  methocarbamol (ROBAXIN) 500 MG tablet Take 1 tablet (500 mg total) by mouth 2 (two) times daily. Patient not taking: Reported on 10/10/2020 06/11/20   Dietrich Pates, PA-C  naproxen (NAPROSYN) 500 MG tablet Take 1 tablet (500 mg total) by mouth 2 (two) times daily. Patient not taking: Reported on 10/10/2020 06/11/20   Dietrich Pates, PA-C    Allergies    Hydrochlorothiazide, Lisinopril, and Broccoli [brassica oleracea]  Review of Systems   Review of Systems  Physical Exam Updated Vital Signs BP (!) 133/107   Pulse (!) 105   Temp 98.9 F (37.2 C) (Oral)   Resp (!) 23   Ht 5\' 10"  (1.778 m)   Wt 102.1 kg   SpO2 96%   BMI 32.28 kg/m   Physical Exam Vitals and nursing note reviewed.  Constitutional:      General: He is in acute distress.     Appearance: Normal appearance. He is diaphoretic.     Comments: Bent over, ill appearing  HENT:     Head: Normocephalic and atraumatic.  Cardiovascular:     Rate and Rhythm: Regular rhythm. Tachycardia present.     Heart sounds: Normal heart sounds. No murmur heard. Pulmonary:     Effort: Pulmonary effort is normal.     Breath sounds: Normal breath sounds.  Abdominal:     General: Abdomen is flat. There is no distension.     Palpations: Abdomen is soft.     Tenderness: There is no abdominal tenderness.  Musculoskeletal:     Right lower leg: No edema.     Left lower leg: No edema.  Skin:    General: Skin is warm.  Neurological:     Mental Status: He is alert and oriented to person, place, and time.     Comments: fluent  Psychiatric:     Comments: Anxious, distressed    ED Results / Procedures / Treatments   Labs (all labs ordered are listed, but only abnormal results are displayed) Labs Reviewed  COMPREHENSIVE METABOLIC PANEL - Abnormal; Notable for the following components:      Result Value   Glucose, Bld 118 (*)    Creatinine, Ser 0.49 (*)    Calcium 8.6 (*)    AST 128 (*)    ALT 56  (*)    Alkaline Phosphatase 128 (*)    Total Bilirubin 1.4 (*)    All other components within normal limits  ETHANOL - Abnormal; Notable for the following components:   Alcohol, Ethyl (B) 280 (*)    All other components within normal limits  LIPASE, BLOOD - Abnormal; Notable for the following components:   Lipase 82 (*)  All other components within normal limits  CBC WITH DIFFERENTIAL/PLATELET - Abnormal; Notable for the following components:   MCV 100.9 (*)    RDW 15.6 (*)    Platelets 95 (*)    All other components within normal limits  MAGNESIUM    EKG EKG Interpretation  Date/Time:  Wednesday October 10 2020 12:37:52 EDT Ventricular Rate:  103 PR Interval:  167 QRS Duration: 90 QT Interval:  326 QTC Calculation: 427 R Axis:   68 Text Interpretation: Sinus tachycardia similar to previous Confirmed by Frederick Peers [phone]) on 10/10/2020 12:55:35 PM  Radiology No results found.  Procedures Procedures   Medications Ordered in ED Medications  LORazepam (ATIVAN) injection 0-4 mg (1 mg Intravenous Given 10/10/20 1240)    Or  LORazepam (ATIVAN) tablet 0-4 mg ( Oral See Alternative 10/10/20 1240)  LORazepam (ATIVAN) injection 0-4 mg (has no administration in time range)    Or  LORazepam (ATIVAN) tablet 0-4 mg (has no administration in time range)  thiamine tablet 100 mg ( Oral See Alternative 10/10/20 1237)    Or  thiamine (B-1) injection 100 mg (100 mg Intravenous Given 10/10/20 1237)  nicotine (NICODERM CQ - dosed in mg/24 hours) patch 21 mg (21 mg Transdermal Patch Applied 10/10/20 1239)  ondansetron (ZOFRAN) injection 4 mg (4 mg Intravenous Given 10/10/20 1239)  lactated ringers bolus 1,000 mL (0 mLs Intravenous Stopped 10/10/20 1518)  ketorolac (TORADOL) 30 MG/ML injection 15 mg (15 mg Intravenous Given 10/10/20 1527)    ED Course  I have reviewed the triage vital signs and the nursing notes.  Pertinent labs & imaging results that were available during my care of the  patient were reviewed by me and considered in my medical decision making (see chart for details).    MDM Rules/Calculators/A&P                           Ill-appearing but mentating normally on exam.  Hypertensive and tachycardic.  Initiated CIWA protocol with Ativan as well as Zofran and fluid bolus.  Also gave nicotine patch.  Lab work shows mild elevation of LFTs consistent with known alcohol abuse, normal anion gap, no electrolyte derangements.  On reassessment, patient was much more comfortable after having received Ativan.  Heart rate approximately 103.  No significant tremulousness or other signs of severe withdrawal to necessitate inpatient treatment.  Provided with Librium taper as well as Zofran to help with his withdrawal symptoms.  Final Clinical Impression(s) / ED Diagnoses Final diagnoses:  Alcohol withdrawal syndrome without complication (HCC)    Rx / DC Orders ED Discharge Orders          Ordered    chlordiazePOXIDE (LIBRIUM) 25 MG capsule        10/10/20 1518    ondansetron (ZOFRAN ODT) 4 MG disintegrating tablet  Every 8 hours PRN        10/10/20 1518             Alleyah Twombly, Ambrose Finland, MD 10/10/20 1623

## 2020-11-13 DIAGNOSIS — F1029 Alcohol dependence with unspecified alcohol-induced disorder: Secondary | ICD-10-CM | POA: Insufficient documentation

## 2020-11-13 DIAGNOSIS — F17210 Nicotine dependence, cigarettes, uncomplicated: Secondary | ICD-10-CM | POA: Insufficient documentation

## 2020-11-13 DIAGNOSIS — Y907 Blood alcohol level of 200-239 mg/100 ml: Secondary | ICD-10-CM | POA: Insufficient documentation

## 2020-11-14 ENCOUNTER — Encounter (HOSPITAL_COMMUNITY): Payer: Self-pay

## 2020-11-14 ENCOUNTER — Other Ambulatory Visit: Payer: Self-pay

## 2020-11-14 ENCOUNTER — Emergency Department (HOSPITAL_COMMUNITY)
Admission: EM | Admit: 2020-11-14 | Discharge: 2020-11-14 | Disposition: A | Discharge status: Home / Self Care | Payer: Managed Care, Other (non HMO) | Attending: Emergency Medicine | Admitting: Emergency Medicine

## 2020-11-14 DIAGNOSIS — F1029 Alcohol dependence with unspecified alcohol-induced disorder: Secondary | ICD-10-CM

## 2020-11-14 LAB — COMPREHENSIVE METABOLIC PANEL
ALT: 37 U/L (ref 0–44)
AST: 88 U/L — ABNORMAL HIGH (ref 15–41)
Albumin: 3.2 g/dL — ABNORMAL LOW (ref 3.5–5.0)
Alkaline Phosphatase: 161 U/L — ABNORMAL HIGH (ref 38–126)
Anion gap: 7 (ref 5–15)
BUN: 8 mg/dL (ref 6–20)
CO2: 27 mmol/L (ref 22–32)
Calcium: 8.4 mg/dL — ABNORMAL LOW (ref 8.9–10.3)
Chloride: 105 mmol/L (ref 98–111)
Creatinine, Ser: 0.51 mg/dL — ABNORMAL LOW (ref 0.61–1.24)
GFR, Estimated: >60 mL/min (ref >60)
Glucose, Bld: 105 mg/dL — ABNORMAL HIGH (ref 70–99)
Potassium: 4.3 mmol/L (ref 3.5–5.1)
Sodium: 139 mmol/L (ref 135–145)
Total Bilirubin: 1.5 mg/dL — ABNORMAL HIGH (ref 0.3–1.2)
Total Protein: 7.3 g/dL (ref 6.5–8.1)

## 2020-11-14 LAB — CBC WITH DIFFERENTIAL/PLATELET
Abs Immature Granulocytes: 0.1 10*3/uL — ABNORMAL HIGH (ref 0.00–0.07)
Basophils Absolute: 0.1 10*3/uL (ref 0.0–0.1)
Basophils Relative: 1 %
Eosinophils Absolute: 0.2 10*3/uL (ref 0.0–0.5)
Eosinophils Relative: 2 %
HCT: 43.1 % (ref 39.0–52.0)
Hemoglobin: 14.9 g/dL (ref 13.0–17.0)
Immature Granulocytes: 1 %
Lymphocytes Relative: 23 %
Lymphs Abs: 1.9 10*3/uL (ref 0.7–4.0)
MCH: 35.1 pg — ABNORMAL HIGH (ref 26.0–34.0)
MCHC: 34.6 g/dL (ref 30.0–36.0)
MCV: 101.4 fL — ABNORMAL HIGH (ref 80.0–100.0)
Monocytes Absolute: 1.3 10*3/uL — ABNORMAL HIGH (ref 0.1–1.0)
Monocytes Relative: 16 %
Neutro Abs: 4.6 10*3/uL (ref 1.7–7.7)
Neutrophils Relative %: 57 %
Platelets: 87 10*3/uL — ABNORMAL LOW (ref 150–400)
RBC: 4.25 MIL/uL (ref 4.22–5.81)
RDW: 15.5 % (ref 11.5–15.5)
WBC: 8.2 10*3/uL (ref 4.0–10.5)
nRBC: 0 % (ref 0.0–0.2)

## 2020-11-14 LAB — ETHANOL: Alcohol, Ethyl (B): 235 mg/dL — ABNORMAL HIGH (ref <10)

## 2020-11-14 MED ORDER — CHLORDIAZEPOXIDE HCL 25 MG PO CAPS: ORAL_CAPSULE | ORAL | 0 refills | Status: DC

## 2020-11-14 MED ORDER — CHLORDIAZEPOXIDE HCL 25 MG PO CAPS
50.0000 mg | ORAL_CAPSULE | Freq: Once | ORAL | Status: AC
  Administered 2020-11-14: 50 mg via ORAL
  Filled 2020-11-14: qty 2

## 2020-11-14 NOTE — ED Notes (Signed)
Pt had difficulty maintaining gait with ambulation, pt expressed he could walk himself. I placed him in a wheelchair due to so.

## 2020-11-14 NOTE — ED Notes (Signed)
Pt ambulatory in triage. 

## 2020-11-14 NOTE — ED Provider Notes (Signed)
[facility]-EMERGENCY DEPT [facility] Emergency Department Provider Note MRN:  [id number]  Arrival date & time: 11/14/20     Chief Complaint   Alcohol Problem   History of Present Illness   Troy Ponce is a 45 y.o. year-old male with a history of alcohol use disorder presenting to the ED with chief complaint of alcohol problem.  Patient has alcohol addiction, drinks 30 beers per day.  Here for help detoxing.  Has been trying to do this as an outpatient but having trouble finding a place.  Did outpatient detox several weeks ago for a week but he relapsed.  Last drink 2 hours ago.  Worried that he is going to start withdrawing soon.  Denies any chest pain or shortness of breath, no abdominal pain, no other complaints.  Review of Systems  A complete 10 system review of systems was obtained and all systems are negative except as noted in the HPI and PMH.   Patient's Health History    Past Medical History:  Diagnosis Date   Allergy    Bipolar disorder (HCC)    ETOH abuse    ETOHism (HCC)    Pneumonia    Tobacco abuse     Past Surgical History:  Procedure Laterality Date   APPENDECTOMY     HERNIA REPAIR     SHOULDER ARTHROSCOPY      Family History  Problem Relation Age of Onset   Hypertension Mother    Hypertension Father    Cancer Maternal Grandmother    Heart disease Maternal Grandfather    Cancer Paternal Grandmother    Diabetes Paternal Grandmother    Cancer Paternal Grandfather     Social History   Socioeconomic History   Marital status: Single    Spouse name: Not on file   Number of children: Not on file   Years of education: Not on file   Highest education level: Not on file  Occupational History   Not on file  Tobacco Use   Smoking status: Every Day    Packs/day: 2.00    Years: 30.00    Pack years: 60.00    Types: Cigarettes    Start date: 03/11/1984   Smokeless tobacco: Never  Vaping Use   Vaping Use: Never used  Substance and Sexual Activity    Alcohol use: Yes    Alcohol/week: 6.0 standard drinks    Types: 6 Cans of beer per week   Drug use: No    Comment: adderall   Sexual activity: Not Currently  Other Topics Concern   Not on file  Social History Narrative   Not on file   Social Determinants of Health   Financial Resource Strain: Not on file  Food Insecurity: Not on file  Transportation Needs: Not on file  Physical Activity: Not on file  Stress: Not on file  Social Connections: Not on file  Intimate Partner Violence: Not on file     Physical Exam   Vitals:   11/14/20 0200 11/14/20 0220  BP: (!) 159/90 (!) 153/111  Pulse: 90 77  Resp: 20 20  Temp:    SpO2: 93% 95%    CONSTITUTIONAL: Well-appearing, NAD NEURO:  Alert and oriented x 3, no focal deficits EYES:  eyes equal and reactive ENT/NECK:  no LAD, no JVD CARDIO: Regular rate, well-perfused, normal S1 and S2 PULM:  CTAB no wheezing or rhonchi GI/GU:  normal bowel sounds, non-distended, non-tender MSK/SPINE:  No gross deformities, no edema SKIN:  no rash, atraumatic  PSYCH:  Appropriate speech and behavior  *Additional and/or pertinent findings included in MDM below  Diagnostic and Interventional Summary    EKG Interpretation  Date/Time:    Ventricular Rate:    PR Interval:    QRS Duration:   QT Interval:    QTC Calculation:   R Axis:     Text Interpretation:         Labs Reviewed  CBC WITH DIFFERENTIAL/PLATELET - Abnormal; Notable for the following components:      Result Value   MCV 101.4 (*)    MCH 35.1 (*)    Platelets 87 (*)    Monocytes Absolute 1.3 (*)    Abs Immature Granulocytes 0.10 (*)    All other components within normal limits  COMPREHENSIVE METABOLIC PANEL - Abnormal; Notable for the following components:   Glucose, Bld 105 (*)    Creatinine, Ser 0.51 (*)    Calcium 8.4 (*)    Albumin 3.2 (*)    AST 88 (*)    Alkaline Phosphatase 161 (*)    Total Bilirubin 1.5 (*)    All other components within normal limits   ETHANOL - Abnormal; Notable for the following components:   Alcohol, Ethyl (B) 235 (*)    All other components within normal limits    No orders to display    Medications  chlordiazePOXIDE (LIBRIUM) capsule 50 mg (50 mg Oral Given 11/14/20 0114)     Procedures  /  Critical Care Procedures  ED Course and Medical Decision Making  I have reviewed the triage vital signs, the nursing notes, and pertinent available records from the EMR.  Listed above are laboratory and imaging tests that I personally ordered, reviewed, and interpreted and then considered in my medical decision making (see below for details).  Patient is currently without signs of acute alcohol withdrawal, exam more consistent with alcohol intoxication.  Feels like he is starting to withdrawal, given dose of Librium.  On reassessment patient is resting comfortably, wakes easily, feels a lot better, labs reassuring, appropriate for discharge with outpatient management.       Elmer Sow. Pilar Plate, MD [facility] Emergency Medicine [facility] [email or web address]  Final Clinical Impressions(s) / ED Diagnoses     ICD-10-CM   1. Alcohol dependence with unspecified alcohol-induced disorder (HCC)  F10.29       ED Discharge Orders          Ordered    chlordiazePOXIDE (LIBRIUM) 25 MG capsule        11/14/20 0236             Discharge Instructions Discussed with and Provided to Patient:    Discharge Instructions      You were evaluated in the Emergency Department and after careful evaluation, we did not find any emergent condition requiring admission or further testing in the hospital.  Your exam/testing today was overall reassuring.  Use the Librium medication as needed for withdrawal symptoms.  Follow-up with your outpatient substance abuse center.  Please return to the Emergency Department if you experience any worsening of your condition.  Thank you for allowing [place] to be a part of your  care.        Sabas Sous, MD 11/14/20 [phone]

## 2020-11-14 NOTE — Discharge Instructions (Addendum)
You were evaluated in the Emergency Department and after careful evaluation, we did not find any emergent condition requiring admission or further testing in the hospital.  Your exam/testing today was overall reassuring.  Use the Librium medication as needed for withdrawal symptoms.  Follow-up with your outpatient substance abuse center.  Please return to the Emergency Department if you experience any worsening of your condition.  Thank you for allowing [place] to be a part of your care.

## 2020-11-14 NOTE — ED Triage Notes (Signed)
Pt reports drinking 30 + beers daily and states that he wants to be placed inpatient for detox. Pt states that his last beer was 2 hrs ago.

## 2021-06-02 ENCOUNTER — Emergency Department (HOSPITAL_COMMUNITY): Payer: Managed Care, Other (non HMO)

## 2021-06-02 ENCOUNTER — Emergency Department (HOSPITAL_COMMUNITY)
Admission: EM | Admit: 2021-06-02 | Discharge: 2021-06-02 | Disposition: A | Discharge status: Home / Self Care | Payer: Managed Care, Other (non HMO) | Attending: Emergency Medicine | Admitting: Emergency Medicine

## 2021-06-02 ENCOUNTER — Encounter (HOSPITAL_COMMUNITY): Payer: Self-pay

## 2021-06-02 ENCOUNTER — Other Ambulatory Visit: Payer: Self-pay

## 2021-06-02 DIAGNOSIS — Z79899 Other long term (current) drug therapy: Secondary | ICD-10-CM | POA: Insufficient documentation

## 2021-06-02 DIAGNOSIS — Y908 Blood alcohol level of 240 mg/100 ml or more: Secondary | ICD-10-CM | POA: Diagnosis not present

## 2021-06-02 DIAGNOSIS — J189 Pneumonia, unspecified organism: Secondary | ICD-10-CM | POA: Diagnosis not present

## 2021-06-02 DIAGNOSIS — F10120 Alcohol abuse with intoxication, uncomplicated: Secondary | ICD-10-CM | POA: Insufficient documentation

## 2021-06-02 DIAGNOSIS — F10920 Alcohol use, unspecified with intoxication, uncomplicated: Secondary | ICD-10-CM

## 2021-06-02 DIAGNOSIS — I2699 Other pulmonary embolism without acute cor pulmonale: Secondary | ICD-10-CM | POA: Diagnosis not present

## 2021-06-02 DIAGNOSIS — Z20822 Contact with and (suspected) exposure to covid-19: Secondary | ICD-10-CM | POA: Diagnosis not present

## 2021-06-02 DIAGNOSIS — F1010 Alcohol abuse, uncomplicated: Secondary | ICD-10-CM

## 2021-06-02 LAB — CBC WITH DIFFERENTIAL/PLATELET
Abs Immature Granulocytes: 0.06 10*3/uL (ref 0.00–0.07)
Basophils Absolute: 0 10*3/uL (ref 0.0–0.1)
Basophils Relative: 1 %
Eosinophils Absolute: 0.1 10*3/uL (ref 0.0–0.5)
Eosinophils Relative: 2 %
HCT: 41 % (ref 39.0–52.0)
Hemoglobin: 13.4 g/dL (ref 13.0–17.0)
Immature Granulocytes: 1 %
Lymphocytes Relative: 27 %
Lymphs Abs: 2 10*3/uL (ref 0.7–4.0)
MCH: 31.8 pg (ref 26.0–34.0)
MCHC: 32.7 g/dL (ref 30.0–36.0)
MCV: 97.2 fL (ref 80.0–100.0)
Monocytes Absolute: 0.8 10*3/uL (ref 0.1–1.0)
Monocytes Relative: 11 %
Neutro Abs: 4.3 10*3/uL (ref 1.7–7.7)
Neutrophils Relative %: 58 %
Platelets: 102 10*3/uL — ABNORMAL LOW (ref 150–400)
RBC: 4.22 MIL/uL (ref 4.22–5.81)
RDW: 16.4 % — ABNORMAL HIGH (ref 11.5–15.5)
WBC: 7.3 10*3/uL (ref 4.0–10.5)
nRBC: 0 % (ref 0.0–0.2)

## 2021-06-02 LAB — COMPREHENSIVE METABOLIC PANEL
ALT: 26 U/L (ref 0–44)
AST: 39 U/L (ref 15–41)
Albumin: 3.3 g/dL — ABNORMAL LOW (ref 3.5–5.0)
Alkaline Phosphatase: 92 U/L (ref 38–126)
Anion gap: 10 (ref 5–15)
BUN: 7 mg/dL (ref 6–20)
CO2: 22 mmol/L (ref 22–32)
Calcium: 8.5 mg/dL — ABNORMAL LOW (ref 8.9–10.3)
Chloride: 109 mmol/L (ref 98–111)
Creatinine, Ser: 0.56 mg/dL — ABNORMAL LOW (ref 0.61–1.24)
GFR, Estimated: >60 mL/min (ref >60)
Glucose, Bld: 89 mg/dL (ref 70–99)
Potassium: 4 mmol/L (ref 3.5–5.1)
Sodium: 141 mmol/L (ref 135–145)
Total Bilirubin: 0.9 mg/dL (ref 0.3–1.2)
Total Protein: 6.3 g/dL — ABNORMAL LOW (ref 6.5–8.1)

## 2021-06-02 LAB — RAPID URINE DRUG SCREEN, HOSP PERFORMED
Amphetamines: NOT DETECTED
Barbiturates: NOT DETECTED
Benzodiazepines: NOT DETECTED
Cocaine: NOT DETECTED
Opiates: NOT DETECTED
Tetrahydrocannabinol: NOT DETECTED

## 2021-06-02 LAB — RESP PANEL BY RT-PCR (FLU A&B, COVID) ARPGX2
Influenza A by PCR: NEGATIVE
Influenza B by PCR: NEGATIVE
SARS Coronavirus 2 by RT PCR: NEGATIVE

## 2021-06-02 LAB — ETHANOL: Alcohol, Ethyl (B): 263 mg/dL — ABNORMAL HIGH (ref <10)

## 2021-06-02 MED ORDER — CHLORDIAZEPOXIDE HCL 25 MG PO CAPS: 25.0000 mg | ORAL_CAPSULE | Freq: Three times a day (TID) | ORAL | 0 refills | Status: AC

## 2021-06-02 MED ORDER — AZITHROMYCIN 250 MG PO TABS: 250.0000 mg | ORAL_TABLET | Freq: Every day | ORAL | 0 refills | Status: AC

## 2021-06-02 MED ORDER — DOXYCYCLINE HYCLATE 100 MG PO CAPS: 100.0000 mg | ORAL_CAPSULE | Freq: Two times a day (BID) | ORAL | 0 refills | Status: AC

## 2021-06-02 MED ORDER — LACTATED RINGERS IV BOLUS
1000.0000 mL | Freq: Once | INTRAVENOUS | Status: AC
  Administered 2021-06-02: 1000 mL via INTRAVENOUS

## 2021-06-02 MED ORDER — IOHEXOL 350 MG/ML SOLN
100.0000 mL | Freq: Once | INTRAVENOUS | Status: AC | PRN
  Administered 2021-06-02: 100 mL via INTRAVENOUS

## 2021-06-02 MED ORDER — LORAZEPAM 2 MG/ML IJ SOLN
2.0000 mg | Freq: Once | INTRAMUSCULAR | Status: AC
  Administered 2021-06-02: 2 mg via INTRAVENOUS
  Filled 2021-06-02: qty 1

## 2021-06-02 MED ORDER — AZITHROMYCIN 500 MG IV SOLR
500.0000 mg | Freq: Once | INTRAVENOUS | Status: AC
  Administered 2021-06-02: 500 mg via INTRAVENOUS
  Filled 2021-06-02: qty 5

## 2021-06-02 MED ORDER — SODIUM CHLORIDE 0.9 % IV SOLN
1.0000 g | Freq: Once | INTRAVENOUS | Status: AC
  Administered 2021-06-02: 1 g via INTRAVENOUS
  Filled 2021-06-02: qty 10

## 2021-06-02 NOTE — Discharge Instructions (Addendum)
You have been given a prescription for Librium taper for alcohol withdrawal ?You are also given prescription for antibiotics for possible early pneumonia ?Please take medications as prescribed ?You are given resources for outpatient substance abuse.  Please call in the morning you have been medically cleared here tonight ?

## 2021-06-02 NOTE — ED Notes (Signed)
This RN went into room to find that patient had removed his PIV. Patient stated "It hurt and I don't need it so I ripped it out." This RN will attempt to place another PIV at this time.  ?

## 2021-06-02 NOTE — ED Notes (Signed)
Patient resting at this time, eyes closed, respirations even and unlabored. Patient continues to take off BP cuff but cardiac monitor and pulse ox remain in place. Bed in low and locked position.  ?

## 2021-06-02 NOTE — ED Provider Notes (Incomplete)
[facility] EMERGENCY DEPARTMENT Provider Note   CSN: [id number] Arrival date & time: 06/02/21  1719     History {Add pertinent medical, surgical, social history, OB history to HPI:1} Chief Complaint  Patient presents with   Alcohol Problem    Patient arrives to hospital wanting to detox from alcohol. Patient being uncooperative during triage. Patient states last drink was a couple hours ago.     Troy Ponce is a 46 y.o. male.  HPI 46 year old male presents today stating that he has been abusing alcohol and other substances and needs to be detoxed.  Patient states that he has been using multiple substances over the past month.  Reports that he was not drinking for 6 months.  He states that he began having some pain at the time of dental surgery and began using Percocet secondary to this.  He is now been drinking large amounts of alcohol.  He states that he is feeling very agitated and needs some help.  He has been very irritable and agitated with staff.     Home Medications Prior to Admission medications   Medication Sig Start Date End Date Taking? Authorizing Provider  albuterol (PROVENTIL HFA;VENTOLIN HFA) 108 (90 Base) MCG/ACT inhaler Inhale 1-2 puffs into the lungs every 6 (six) hours as needed for wheezing or shortness of breath. 05/11/18   Janeece Fitting, PA-C  amLODipine (NORVASC) 10 MG tablet Take 10 mg by mouth at bedtime.    [provider]  amphetamine-dextroamphetamine (ADDERALL) 20 MG tablet Take 20-60 mg by mouth daily.    [provider]  benzonatate (TESSALON) 100 MG capsule Take 1 capsule (100 mg total) by mouth every 8 (eight) hours. Patient not taking: No sig reported 04/23/20   Eustaquio Maize, PA-C  chlordiazePOXIDE (LIBRIUM) 25 MG capsule 63m PO TID x 1D, then 25-562mPO BID X 1D, then 25-5040mO QD X 1D 11/14/20   BerMaudie FlakesD  chlorhexidine (PERIDEX) 0.12 % solution Use as directed 15 mLs in the mouth or throat 2  (two) times daily.    [provider]  gabapentin (NEURONTIN) 300 MG capsule Take 1 capsule (300 mg total) by mouth 3 (three) times daily. Patient not taking: Reported on 10/10/2020 05/17/18   LorPatrecia PourP  methocarbamol (ROBAXIN) 500 MG tablet Take 1 tablet (500 mg total) by mouth 2 (two) times daily. Patient not taking: Reported on 10/10/2020 06/11/20   KhaDelia HeadyA-C  naproxen (NAPROSYN) 500 MG tablet Take 1 tablet (500 mg total) by mouth 2 (two) times daily. Patient not taking: Reported on 10/10/2020 06/11/20   KhaDelia HeadyA-C  omeprazole (PRILOSEC) 20 MG capsule Take 20 mg by mouth 2 (two) times daily before a meal.    [provider]  ondansetron (ZOFRAN ODT) 4 MG disintegrating tablet Take 1 tablet (4 mg total) by mouth every 8 (eight) hours as needed for nausea or vomiting. 10/10/20   Little, RacWenda OverlandD  sildenafil (VIAGRA) 100 MG tablet Take 100 mg by mouth daily as needed for erectile dysfunction.    [provider]      Allergies    Hydrochlorothiazide, Lisinopril, and Broccoli [brassica oleracea]    Review of Systems   Review of Systems  All other systems reviewed and are negative.  Physical Exam Updated Vital Signs BP (!) 155/83 (BP Location: Right Arm)    Pulse 88    Temp 97.8 F (36.6 C) (Oral)    Resp 19  SpO2 97%  Physical Exam Vitals and nursing note reviewed.  Constitutional:      Appearance: Normal appearance. He is obese.  HENT:     Head: Normocephalic.     Right Ear: External ear normal.     Left Ear: External ear normal.     Nose: Nose normal.     Mouth/Throat:     Pharynx: Oropharynx is clear.  Eyes:     Pupils: Pupils are equal, round, and reactive to light.  Cardiovascular:     Rate and Rhythm: Normal rate and regular rhythm.  Pulmonary:     Effort: Pulmonary effort is normal.     Comments: Some diffuse scattered rhonchi noted Abdominal:     General: Bowel sounds are normal.     Palpations: Abdomen is  soft.  Musculoskeletal:     Cervical back: Normal range of motion.  Skin:    General: Skin is warm.  Neurological:     General: No focal deficit present.     Mental Status: He is alert.  Psychiatric:        Mood and Affect: Mood normal.    ED Results / Procedures / Treatments   Labs (all labs ordered are listed, but only abnormal results are displayed) Labs Reviewed  RESP PANEL BY RT-PCR (FLU A&B, COVID) [id number]  COMPREHENSIVE METABOLIC PANEL  ETHANOL  RAPID URINE DRUG SCREEN, HOSP PERFORMED  CBC WITH DIFFERENTIAL/PLATELET    EKG None  Radiology No results found.  Procedures Procedures  {Document cardiac monitor, telemetry assessment procedure when appropriate:1}  Medications Ordered in ED Medications  lactated ringers bolus 1,000 mL (has no administration in time range)  LORazepam (ATIVAN) injection 2 mg (has no administration in time range)    ED Course/ Medical Decision Making/ A&P Clinical Course as of 06/02/21 2228  Sun Jun 02, 2021  1837 X-Dorna Mallet reviewed interpreted and likely poor inspiratory effort Radiologist interpretation noted Will obtain PA and lateral [DR]  1933 C-Met reviewed and interpreted with decreased creatinine, calcium, and total protein consistent with poor nutrition [DR]  1934 Ethanol(!) Blood alcohol reviewed and elevated to 63 [DR]  1934 CBC with Diff(!) CBC reviewed and interpreted with normal white blood cell count, normal hemoglobin, and platelets decreased at 102 likely secondary to EtOH abuse [DR]    Clinical Course User Index [DR] Pattricia Boss, MD                           Medical Decision Making 46 year old male with history of alcohol abuse presents today complaining of alcohol use.  On exam here he initially appears intoxicated.  He is given a milligrams of Ativan.  He is remained hemodynamically stable.  He is awake and alert.  He continues to be intermittently abusive of staff.  He was evaluated medically and noted to have a  possible early infiltrate on his chest x-Ashten Sarnowski.  CTA obtained and no evidence of PE.  Oxygen level is 94%, afebrile, normal heart rate and blood pressure.  He is given Rocephin and Zithromax here in the ED. No evidence of acute alcohol withdrawal here with normal heart rate, blood pressure and no shaking noted on exam Plan po antibiotics, will give rx for Librium taper Patient given resource guide.  Amount and/or Complexity of Data Reviewed Labs: ordered. Decision-making details documented in ED Course. Radiology: ordered.  Risk Prescription drug management.   ***  {Document critical care time when appropriate:1} {Document review of labs and  clinical decision tools ie heart score, Chads2Vasc2 etc:1}  {Document your independent review of radiology images, and any outside records:1} {Document your discussion with family members, caretakers, and with consultants:1} {Document social determinants of health affecting pt's care:1} {Document your decision making why or why not admission, treatments were needed:1} Final Clinical Impression(s) / ED Diagnoses Final diagnoses:  None    Rx / DC Orders ED Discharge Orders     None

## 2021-06-02 NOTE — ED Notes (Signed)
Patient provided with something to eat. Patient continues to take off cardiac leads and pulse ox although this RN has verbalized to patient several times to not take himself off of the monitor.  ?

## 2021-06-02 NOTE — ED Notes (Signed)
This RN went into room to find that patient had ripped out his IV while his antibiotic was running. Patient states "The fucking doctor told me that I was going home so I need an ambulance to [facility] now." This RN verbalized to patient that we do not provide ambulances to [facility] but that if he was ready to leave he could do so on his own accord. Patient states "I don't fucking trust you, I don't trust any of you." Patient sitting on the edge of stretcher refusing to leave. Provider aware of same.  ?

## 2021-06-02 NOTE — ED Notes (Signed)
RN attempted to triage pt,  pt was sitting in wheelchair with head leaned back and eyes closed. Pt was not responding to RN, RN sternal rubbed pt, pt said "fuck off". RN asked pt  why he was here, pt stated "I don't have to tell you shit". RN explained that I couldn't help if I don't know what's going on, pt stated "either you're helping me or you're not." Pt continued to curse at RN, making inappropriate and sexual comments. Security and charge nurse called. ?

## 2021-06-02 NOTE — ED Notes (Signed)
This RN attempted to go over discharge paper work with patient but patient refusing to listen to this RN. Patient states that he has multiple complaints about this hospital. Patient demanding this RN to wash dried blood off of his arm from ripping out PIV. This RN directed patient to where the bathroom was and offered him wash cloths and soap. Patient stated "Oh you're kicking me out? You're fucking kicking me out when I have pneumonia and dried blood on my arm? You are fucking ridiculous and I demand you give me a ride since it's cold out." Patient still sitting at the edge of stretcher refusing to leave.  ?

## 2021-06-02 NOTE — ED Notes (Signed)
Patient walked out and left without discharge paperwork. Patient stable and ambulatory at time of discharge.  ?

## 2021-06-02 NOTE — ED Notes (Signed)
Pt came to [facility] zone stating "my lawyer says y'all have no right to kick me out." This RN called charge RN, who states that security is to walk pt out of building. When security was called, pt stated "you are gonna call fucking security on me?" Security with pt and walking pt out at this time.  ?

## 2021-06-14 ENCOUNTER — Telehealth (HOSPITAL_COMMUNITY): Payer: Self-pay | Admitting: Licensed Clinical Social Worker

## 2021-06-14 NOTE — Telephone Encounter (Signed)
The therapist contacts Kota explaining that he is calling from [facility]. Jodie says that he does not really have time to talk as he is heading out to an appointment. The therapist explains that he was just calling to assure that Ernie got connected to services. Roderic Palau says that he "has that covered" noting that he spent a week at [facility] and is set up with IOP apparently at the [facility] and is attending AA and has a Publishing copy. ? ?Adam Phenix, MA, LCSW, [facility], LCAS ?06/14/2021 ?
